# Patient Record
Sex: Male | Born: 1950 | Race: White | Hispanic: No | Marital: Married | Smoking: Former smoker
Health system: Southern US, Community
[De-identification: ages and names within clinical notes are randomized; demographics above are authoritative.]

## PROBLEM LIST (undated history)

## (undated) DIAGNOSIS — B019 Varicella without complication: Secondary | ICD-10-CM

## (undated) DIAGNOSIS — I10 Essential (primary) hypertension: Secondary | ICD-10-CM

## (undated) DIAGNOSIS — J301 Allergic rhinitis due to pollen: Secondary | ICD-10-CM

## (undated) DIAGNOSIS — E7800 Pure hypercholesterolemia, unspecified: Secondary | ICD-10-CM

## (undated) DIAGNOSIS — M47899 Other spondylosis, site unspecified: Secondary | ICD-10-CM

## (undated) DIAGNOSIS — M1990 Unspecified osteoarthritis, unspecified site: Secondary | ICD-10-CM

## (undated) DIAGNOSIS — C61 Malignant neoplasm of prostate: Secondary | ICD-10-CM

## (undated) HISTORY — DX: Essential (primary) hypertension: I10

## (undated) HISTORY — DX: Unspecified osteoarthritis, unspecified site: M19.90

## (undated) HISTORY — DX: Pure hypercholesterolemia, unspecified: E78.00

## (undated) HISTORY — DX: Varicella without complication: B01.9

## (undated) HISTORY — DX: Allergic rhinitis due to pollen: J30.1

## (undated) HISTORY — DX: Other spondylosis, site unspecified: M47.899

## (undated) HISTORY — DX: Malignant neoplasm of prostate: C61

---
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
## 1983-11-11 HISTORY — PX: OTHER SURGICAL HISTORY: SHX169

## 2005-04-08 ENCOUNTER — Encounter: Admission: RE | Admit: 2005-04-08 | Discharge: 2005-07-07 | Payer: Self-pay | Admitting: Family Medicine

## 2007-05-26 ENCOUNTER — Ambulatory Visit: Payer: Self-pay | Admitting: Internal Medicine

## 2007-06-07 ENCOUNTER — Encounter: Payer: Self-pay | Admitting: Internal Medicine

## 2007-06-07 ENCOUNTER — Ambulatory Visit: Payer: Self-pay | Admitting: Internal Medicine

## 2008-06-08 ENCOUNTER — Encounter: Admission: RE | Admit: 2008-06-08 | Discharge: 2008-06-08 | Payer: Self-pay | Admitting: Family Medicine

## 2010-12-02 ENCOUNTER — Encounter: Payer: Self-pay | Admitting: Family Medicine

## 2011-03-19 ENCOUNTER — Encounter: Payer: Self-pay | Admitting: Family Medicine

## 2011-03-19 ENCOUNTER — Ambulatory Visit (INDEPENDENT_AMBULATORY_CARE_PROVIDER_SITE_OTHER): Payer: PRIVATE HEALTH INSURANCE | Admitting: Family Medicine

## 2011-03-19 DIAGNOSIS — E785 Hyperlipidemia, unspecified: Secondary | ICD-10-CM

## 2011-03-19 DIAGNOSIS — Z79899 Other long term (current) drug therapy: Secondary | ICD-10-CM

## 2011-03-19 DIAGNOSIS — M47899 Other spondylosis, site unspecified: Secondary | ICD-10-CM

## 2011-03-19 DIAGNOSIS — Z125 Encounter for screening for malignant neoplasm of prostate: Secondary | ICD-10-CM

## 2011-03-19 DIAGNOSIS — M479 Spondylosis, unspecified: Secondary | ICD-10-CM

## 2011-03-19 DIAGNOSIS — I10 Essential (primary) hypertension: Secondary | ICD-10-CM

## 2011-03-19 HISTORY — DX: Other spondylosis, site unspecified: M47.899

## 2011-03-19 NOTE — Patient Instructions (Signed)
F/u 9-08/2011 for CPX, 30 mins

## 2011-03-19 NOTE — Progress Notes (Signed)
60 year old male:  Est care:  Rheum dz, sees Dr. Corliss Skains HLA B27, on sulfasalazine  HTN: elevated today, a few elevated in record review  Chol: very elevated, on crestor, tricor  Records reviewed  ROS: GEN: No acute illnesses, no fevers, chills. GI: No n/v/d, eating normally Pulm: No SOB Interactive and getting along well at home.  Otherwise, ROS is as per the HPI.  Patient Active Problem List  Diagnoses  . Hyperlipidemia LDL goal < 100  . Hypertension  . HLA-B27 spondyloarthropathy  . Special screening for malignant neoplasm of prostate  . Encounter for long-term (current) use of other medications   Past Medical History  Diagnosis Date  . Arthritis   . Chicken pox   . High cholesterol   . Hypertension 03/19/2011  . HLA-B27 spondyloarthropathy 03/19/2011  . Allergic rhinitis due to pollen    Past Surgical History  Procedure Date  . Tonsillectomy 1958  . Vascetomy 1985   History  Substance Use Topics  . Smoking status: Former Smoker -- 20 years    Types: Cigarettes  . Smokeless tobacco: Former Neurosurgeon    Quit date: 11/10/2000  . Alcohol Use: No   No family history on file. No Known Allergies No current outpatient prescriptions on file prior to visit.    ROS: GEN: No acute illnesses, no fevers, chills. GI: No n/v/d, eating normally Pulm: No SOB Interactive and getting along well at home.  Otherwise, ROS is as per the HPI.  GEN: WDWN, NAD, Non-toxic, A & O x 3 HEENT: Atraumatic, Normocephalic. Neck supple. No masses, No LAD. Ears and Nose: No external deformity. CV: RRR, No M/G/R. No JVD. No thrill. No extra heart sounds. PULM: CTA B, no wheezes, crackles, rhonchi. No retractions. No resp. distress. No accessory muscle use. EXTR: No c/c/e NEURO Normal gait.  PSYCH: Normally interactive. Conversant. Not depressed or anxious appearing.  Calm demeanor.   A/P: Lipids, recheck FLP in a few months, cont med HTN: currently elevated, try to lose 10-15 pounds  ,recheck at [facility] in fall Rheum, f/u with Dr. Algis Downs upcoming

## 2011-03-20 ENCOUNTER — Encounter: Payer: Self-pay | Admitting: Family Medicine

## 2011-07-22 ENCOUNTER — Telehealth: Payer: Self-pay | Admitting: *Deleted

## 2011-07-22 NOTE — Telephone Encounter (Signed)
No need. I can just enter in a direct computer order for a [facility] lab draw. He can go when he wants to the [place] lab and it should work.

## 2011-07-22 NOTE — Telephone Encounter (Signed)
Patient advised.

## 2011-07-22 NOTE — Telephone Encounter (Signed)
Pt cancelled his lab appt for [facility] labs on 9/13 and he is asking if he can have written order faxed to his home fax machine at [phone] to take to [facility] in [place] prior to his appt for his physical.

## 2011-07-24 ENCOUNTER — Other Ambulatory Visit: Payer: PRIVATE HEALTH INSURANCE

## 2011-07-30 ENCOUNTER — Other Ambulatory Visit: Payer: Self-pay | Admitting: Family Medicine

## 2011-07-30 DIAGNOSIS — Z125 Encounter for screening for malignant neoplasm of prostate: Secondary | ICD-10-CM

## 2011-07-30 DIAGNOSIS — R5383 Other fatigue: Secondary | ICD-10-CM

## 2011-07-30 DIAGNOSIS — I10 Essential (primary) hypertension: Secondary | ICD-10-CM

## 2011-07-30 DIAGNOSIS — E785 Hyperlipidemia, unspecified: Secondary | ICD-10-CM

## 2011-07-30 NOTE — Progress Notes (Signed)
Handwritten order given 

## 2011-07-30 NOTE — Telephone Encounter (Signed)
Pt states he went to [facility] this morning to have blood drawn and they didn't have any record of what to draw, he is asking for order.  Has appt to see you on 9/24.

## 2011-07-30 NOTE — Progress Notes (Signed)
There is more then one [facility] lab in [place] and i dont know which one he is at.   Spoke with lab and they said that the lab orders do not show up on their computers and patient will need a written lab order to take with them to the lab.

## 2011-07-30 NOTE — Progress Notes (Deleted)
  Subjective:    Patient ID: Dylan Carter, male    DOB: 21-Feb-1951, 60 y.o.   MRN: [id number]  HPI    Review of Systems     Objective:   Physical Exam        Assessment & Plan:  Spoke with [facility] lab and they said that patient will need a written order to take with them

## 2011-07-30 NOTE — Progress Notes (Signed)
Call the lab and see if they can do it now. May have been done incorrectly before.

## 2011-07-30 NOTE — Telephone Encounter (Signed)
Lab order faxed to patient and lab

## 2011-07-31 ENCOUNTER — Encounter: Payer: PRIVATE HEALTH INSURANCE | Admitting: Family Medicine

## 2011-08-04 ENCOUNTER — Encounter: Payer: Self-pay | Admitting: Family Medicine

## 2011-08-04 ENCOUNTER — Ambulatory Visit (INDEPENDENT_AMBULATORY_CARE_PROVIDER_SITE_OTHER): Payer: PRIVATE HEALTH INSURANCE | Admitting: Family Medicine

## 2011-08-04 VITALS — BP 130/80 | HR 92 | Temp 98.8°F | Ht 70.0 in | Wt 198.8 lb

## 2011-08-04 DIAGNOSIS — D1730 Benign lipomatous neoplasm of skin and subcutaneous tissue of unspecified sites: Secondary | ICD-10-CM

## 2011-08-04 DIAGNOSIS — D1739 Benign lipomatous neoplasm of skin and subcutaneous tissue of other sites: Secondary | ICD-10-CM

## 2011-08-04 DIAGNOSIS — Z0000 Encounter for general adult medical examination without abnormal findings: Secondary | ICD-10-CM

## 2011-08-04 DIAGNOSIS — M722 Plantar fascial fibromatosis: Secondary | ICD-10-CM

## 2011-08-04 NOTE — Progress Notes (Signed)
Subjective:    Patient ID: Dylan Carter, male    DOB: Apr 22, 1951, 60 y.o.   MRN: [id number]  HPI  Dylan Carter, a 60 y.o. male presents today in the office for the following:    Colon - Dr. Marina Goodell, due around 2015.  Tdap - pt reports utd  Has noticed a couple of lumps on his left anterior chest wall. Has been there for the last couple of months.  Also having some pain in his left heel. As walking will feel OK. Will last for about 30 minutes. Barefooted will hurt.   Preventative Health Maintenance Visit:  Health Maintenance Summary Reviewed and updated, unless pt declines services.  Tobacco History Reviewed. Alcohol: No concerns, no excessive use Exercise Habits: Some activity, rec at least 30 mins 5 times a week STD concerns: no risk or activity to increase risk Drug Use: None Encouraged self-testicular check  Labs reviewed with the patient. (scanned from [facility])   Review of Systems General: Denies fever, chills, sweats. No significant weight loss. Eyes: Denies blurring,significant itching ENT: Denies earache, sore throat, and hoarseness. Cardiovascular: Denies chest pains, palpitations, dyspnea on exertion Respiratory: Denies cough, dyspnea at rest,wheeezing Breast: no concerns about lumps GI: Denies nausea, vomiting, diarrhea, constipation, change in bowel habits, abdominal pain, melena, hematochezia GU: Denies penile discharge, ED, urinary flow / outflow problems. No STD concerns. Musculoskeletal: above Derm: above  Neuro: Denies  paresthesias, frequent falls, frequent headaches Psych: Denies depression, anxiety Endocrine: Denies cold intolerance, heat intolerance, polydipsia Heme: Denies enlarged lymph nodes Allergy: No hayfever     Objective:   Physical Exam   Physical Exam  Blood pressure 130/80, pulse 92, temperature 98.8 F (37.1 C), temperature source Oral, height 5\' 10"  (1.778 m), weight 198 lb 12.8 oz (90.175 kg), SpO2 98.00%.  PE: GEN: well  developed, well nourished, no acute distress Eyes: conjunctiva and lids normal, PERRLA, EOMI ENT: TM clear, nares clear, oral exam WNL Neck: supple, no lymphadenopathy, no thyromegaly, no JVD Pulm: clear to auscultation and percussion, respiratory effort normal CV: regular rate and rhythm, S1-S2, no murmur, rub or gallop, no bruits, peripheral pulses normal and symmetric, no cyanosis, clubbing, edema or varicosities Chest: no scars, no gynecomastia  - L ANTERIOR CHEST WALL WITH SMALL MOVEABLE AREAS GI: soft, non-tender; no hepatosplenomegaly, masses; active bowel sounds all quadrants GU: no hernia, testicular mass, penile discharge, priapism or prostate enlargement Lymph: no cervical, axillary or inguinal adenopathy MSK: gait normal, muscle tone and strength WNL, no joint swelling, effusions, discoloration, crepitus . TTP PF INSERTION ON THE L SKIN: clear, good turgor, color WNL, no rashes, lesions, or ulcerations Neuro: normal mental status, normal strength, sensation, and motion Psych: alert; oriented to person, place and time, normally interactive and not anxious or depressed in appearance.       Assessment & Plan:   1. Routine general medical examination at a health care facility   2. Plantar fascia syndrome   3. Lipoma of skin     The patient's preventative maintenance and recommended screening tests for an annual wellness exam were reviewed in full today. Brought up to date unless services declined.  Counselled on the importance of diet, exercise, and its role in overall health and mortality. The patient's FH and SH was reviewed, including their home life, tobacco status, and drug and alcohol status.   PF: We reviewed that stretching is critically important to the treatment of PF. Reviewed footwear. Rigid soles have been shown to help with PF. Reviewed  rehab of stretching and calf raises.   Reassured about lipoma vs cyst

## 2011-08-04 NOTE — Patient Instructions (Signed)
Please read handouts on Plantar Fascitis.  STRETCHING and Strengthening program critically important.  Strengthening on foot and calf muscles as seen in handout. Calf raises, 2 legged, then 1 legged. Foot massage with tennis ball. Ice massage.  NEEDS TO BE DONE EVERY DAY  Recommended over the counter insoles. (Spenco or Hapad)  A rigid shoe with good arch support helps: Dansko (great), Keen, Merrell No easily bendable shoes.   

## 2011-08-07 ENCOUNTER — Encounter: Payer: Self-pay | Admitting: Family Medicine

## 2012-04-19 ENCOUNTER — Telehealth: Payer: Self-pay | Admitting: Family Medicine

## 2012-04-19 NOTE — Telephone Encounter (Signed)
Requests refills on Crestor and Tricor to be sent to [facility] ([facility]  Please call patient back at [phone].

## 2012-04-20 ENCOUNTER — Telehealth: Payer: Self-pay | Admitting: Family Medicine

## 2012-04-20 MED ORDER — FENOFIBRATE 145 MG PO TABS: 145.0000 mg | ORAL_TABLET | Freq: Every day | ORAL | Status: DC

## 2012-04-20 MED ORDER — ROSUVASTATIN CALCIUM 40 MG PO TABS: 40.0000 mg | ORAL_TABLET | Freq: Every day | ORAL | Status: DC

## 2012-04-20 NOTE — Telephone Encounter (Signed)
Pt is needing refill on Tricor 145 mg and Crestor 40 mg. He uses [facility] ??? Fax [phone] His ID# is [id number] and he would like a 90 day supply if possible.

## 2012-04-20 NOTE — Telephone Encounter (Signed)
Already sent in

## 2012-05-31 ENCOUNTER — Encounter (HOSPITAL_COMMUNITY): Payer: Self-pay | Admitting: *Deleted

## 2012-05-31 ENCOUNTER — Emergency Department (HOSPITAL_COMMUNITY)
Admission: EM | Admit: 2012-05-31 | Discharge: 2012-05-31 | Disposition: A | Discharge status: Home / Self Care | Payer: PRIVATE HEALTH INSURANCE | Source: Home / Self Care | Attending: Family Medicine | Admitting: Family Medicine

## 2012-05-31 DIAGNOSIS — J069 Acute upper respiratory infection, unspecified: Secondary | ICD-10-CM

## 2012-05-31 MED ORDER — PREDNISONE 20 MG PO TABS: ORAL_TABLET | ORAL | Status: AC

## 2012-05-31 MED ORDER — HYDROCODONE-ACETAMINOPHEN 7.5-325 MG/15ML PO SOLN: 10.0000 mL | Freq: Three times a day (TID) | ORAL | Status: AC | PRN

## 2012-05-31 MED ORDER — AZITHROMYCIN 250 MG PO TABS: 250.0000 mg | ORAL_TABLET | Freq: Every day | ORAL | Status: AC

## 2012-05-31 NOTE — ED Provider Notes (Signed)
History     CSN: [id number]  Arrival date & time 05/31/12  4098   First MD Initiated Contact with Patient 05/31/12 1846      Chief Complaint  Patient presents with  . Sore Throat    (Consider location/radiation/quality/duration/timing/severity/associated sxs/prior treatment) HPI Comments: 61 year old male former smoker with h/o HTN here c/o cough, congestion and sore throat for about 5 days. Cough improving and non productive but sore throat worsening. Has been doing warm water gargles. Feels discomfort in right side with swallowing, improved rhinorrhea but still some sinus pressure and postnasal drip. Denies fever, headache or chills. appetite is Ok. Denies chest pain or shortness of breath.   Past Medical History  Diagnosis Date  . Arthritis   . Chicken pox   . High cholesterol   . Hypertension 03/19/2011  . HLA-B27 spondyloarthropathy 03/19/2011  . Allergic rhinitis due to pollen     Past Surgical History  Procedure Date  . Tonsillectomy 1958  . Vascetomy 1985    History reviewed. No pertinent family history.  History  Substance Use Topics  . Smoking status: Former Smoker -- 20 years    Types: Cigarettes  . Smokeless tobacco: Former Neurosurgeon    Quit date: 11/10/2000  . Alcohol Use: No      Review of Systems  Constitutional: Negative for fever, chills, diaphoresis, appetite change and fatigue.  HENT: Positive for congestion, sore throat, rhinorrhea, trouble swallowing, postnasal drip and sinus pressure. Negative for facial swelling, neck pain and neck stiffness.   Eyes: Negative for pain, discharge and visual disturbance.  Respiratory: Positive for cough. Negative for shortness of breath and wheezing.   Cardiovascular: Negative for chest pain, palpitations and leg swelling.  Gastrointestinal: Negative for nausea, vomiting, abdominal pain and diarrhea.  Musculoskeletal: Negative for myalgias and arthralgias.  Skin: Negative for rash.  Neurological: Negative for  headaches.    Allergies  Review of patient's allergies indicates no known allergies.  Home Medications   Current Outpatient Rx  Name Route Sig Dispense Refill  . ASPIRIN 81 MG PO TABS Oral Take 81 mg by mouth daily.      Marland Kitchen CALCIUM CARBONATE-VITAMIN D 600-400 MG-UNIT PO TABS Oral Take 1 tablet by mouth daily.      Marland Kitchen DM-PHENYLEPHRINE-ACETAMINOPHEN 10-5-325 MG PO CAPS Oral Take by mouth.    . FENOFIBRATE 145 MG PO TABS Oral Take 1 tablet (145 mg total) by mouth daily. 90 tablet 2  . OMEGA-3 FATTY ACIDS 1000 MG PO CAPS Oral Take 2 g by mouth daily.      Marland Kitchen LORATADINE 10 MG PO TABS Oral Take 10 mg by mouth daily.      Marland Kitchen LYSINE 500 MG PO TABS Oral Take 1 tablet by mouth daily.      . CENTRUM PO Oral Take 1 tablet by mouth daily.      Marland Kitchen ROSUVASTATIN CALCIUM 40 MG PO TABS Oral Take 1 tablet (40 mg total) by mouth daily. 90 tablet 2  . SULFASALAZINE 500 MG PO TABS Oral Take 500 mg by mouth 2 (two) times daily.      Marland Kitchen VITAMIN C 500 MG PO TABS Oral Take 500 mg by mouth daily.      Marland Kitchen VITAMIN E 400 UNITS PO CAPS Oral Take 400 Units by mouth daily.      . AZITHROMYCIN 250 MG PO TABS Oral Take 1 tablet (250 mg total) by mouth daily. Take first 2 tablets together, then 1 every day until finished. 6 tablet 0  .  HYDROCODONE-ACETAMINOPHEN 7.5-325 MG/15ML PO SOLN Oral Take 10 mLs by mouth every 8 (eight) hours as needed for pain (for cough or pain). 120 mL 0  . PREDNISONE 20 MG PO TABS  2 tabs po daily for 3 days 6 tablet 0    BP 149/91  Pulse 76  Temp 98.5 F (36.9 C) (Oral)  Resp 18  SpO2 100%  Physical Exam  Nursing note and vitals reviewed. Constitutional: He is oriented to person, place, and time. He appears well-developed and well-nourished. No distress.  HENT:  Head: Normocephalic and atraumatic.  Right Ear: External ear normal.  Left Ear: External ear normal.       Nasal Congestion with erythema and swelling of nasal turbinates, septal deviation towards right side. Significant pharyngeal  erythema no exudates. No uvula deviation. No trismus. TM's normal  Eyes: Conjunctivae and EOM are normal. Pupils are equal, round, and reactive to light. Right eye exhibits no discharge. Left eye exhibits no discharge. No scleral icterus.  Neck: Normal range of motion. Neck supple. No JVD present. No thyromegaly present.       mildly enlarged right anterior neck side tender adenopathy.   Cardiovascular: Normal rate, regular rhythm and normal heart sounds.   No murmur heard. Pulmonary/Chest: Effort normal and breath sounds normal. He has no wheezes. He has no rales. He exhibits no tenderness.  Lymphadenopathy:    He has cervical adenopathy.  Neurological: He is alert and oriented to person, place, and time.  Skin: No rash noted.    ED Course  Procedures (including critical care time)   Labs Reviewed  POCT RAPID STREP A ([facility] CARE ONLY)   No results found.   1. URI (upper respiratory infection)       MDM  Impress possible viral URI. Although persistent worsening sore throat/tion with small lymph node. Treated with prednisone for 3 days, hydrocodone, continue loratadine, hold prescription for azithromycin to fill if worsening symptoms or if persistent after 48 hours. Return as needed.         Sharin Grave, MD 06/02/12 [phone]

## 2012-05-31 NOTE — ED Notes (Signed)
Pt with c/o sore throat x 4 days - pt had a cough which has resolved - sore throat worse right side of throat

## 2012-06-17 ENCOUNTER — Telehealth: Payer: Self-pay | Admitting: Family Medicine

## 2012-06-17 NOTE — Telephone Encounter (Signed)
Patient wants to know if he can switch from Dr.Copland to Dr.Gutierrez.  Patient's in-laws are Corrie Dandy and Gaspar Cola and they both are patients of Dr.Gutierrez and he has heard a lot of good things about  Dr.Gutierrez and would like to switch.  Can patient switch to Dr.Gutierrez?  He's due for a physical.

## 2012-06-18 ENCOUNTER — Telehealth: Payer: Self-pay | Admitting: Family Medicine

## 2012-06-18 ENCOUNTER — Encounter: Payer: Self-pay | Admitting: Internal Medicine

## 2012-06-18 NOTE — Telephone Encounter (Signed)
Patient will be coming in for a cpx on 08/05/12.  Patient would like a lab order mailed to him,so he can have the lab done before the appointment at [facility] in [place].

## 2012-06-18 NOTE — Telephone Encounter (Signed)
I remember this patient -- he is very nice from my memory. It should not pose any problems.   If that is his preference, then it is OK with me.

## 2012-06-18 NOTE — Telephone Encounter (Signed)
Ok by me if ok by Dr. C. 

## 2012-06-21 NOTE — Telephone Encounter (Signed)
Filled and palced in Kim's box. 

## 2012-06-21 NOTE — Telephone Encounter (Signed)
Mailed as requested.

## 2012-08-04 ENCOUNTER — Encounter: Payer: PRIVATE HEALTH INSURANCE | Admitting: Family Medicine

## 2012-08-05 ENCOUNTER — Ambulatory Visit (INDEPENDENT_AMBULATORY_CARE_PROVIDER_SITE_OTHER): Payer: PRIVATE HEALTH INSURANCE | Admitting: Family Medicine

## 2012-08-05 ENCOUNTER — Encounter: Payer: Self-pay | Admitting: Family Medicine

## 2012-08-05 VITALS — BP 132/78 | HR 60 | Temp 98.0°F | Wt 197.0 lb

## 2012-08-05 DIAGNOSIS — I10 Essential (primary) hypertension: Secondary | ICD-10-CM | POA: Insufficient documentation

## 2012-08-05 DIAGNOSIS — M479 Spondylosis, unspecified: Secondary | ICD-10-CM

## 2012-08-05 DIAGNOSIS — E785 Hyperlipidemia, unspecified: Secondary | ICD-10-CM

## 2012-08-05 DIAGNOSIS — M47899 Other spondylosis, site unspecified: Secondary | ICD-10-CM

## 2012-08-05 DIAGNOSIS — R030 Elevated blood-pressure reading, without diagnosis of hypertension: Secondary | ICD-10-CM

## 2012-08-05 DIAGNOSIS — Z0000 Encounter for general adult medical examination without abnormal findings: Secondary | ICD-10-CM | POA: Insufficient documentation

## 2012-08-05 DIAGNOSIS — Z125 Encounter for screening for malignant neoplasm of prostate: Secondary | ICD-10-CM

## 2012-08-05 LAB — COMPREHENSIVE METABOLIC PANEL
AST: 26 U/L (ref 0–37)
BUN: 14 mg/dL (ref 6–23)
Calcium: 9.9 mg/dL (ref 8.4–10.5)
Chloride: 105 mEq/L (ref 96–112)
Creatinine, Ser: 1 mg/dL (ref 0.4–1.5)
Total Bilirubin: 0.7 mg/dL (ref 0.3–1.2)

## 2012-08-05 LAB — LIPID PANEL
Cholesterol: 174 mg/dL (ref 0–200)
HDL: 32.7 mg/dL — ABNORMAL LOW (ref >39.00)
LDL Cholesterol: 114 mg/dL — ABNORMAL HIGH (ref 0–99)
Total CHOL/HDL Ratio: 5
Triglycerides: 139 mg/dL (ref 0.0–149.0)
VLDL: 27.8 mg/dL (ref 0.0–40.0)

## 2012-08-05 LAB — CBC WITH DIFFERENTIAL/PLATELET
Basophils Relative: 0.9 % (ref 0.0–3.0)
HCT: 45.2 % (ref 39.0–52.0)
Hemoglobin: 14.7 g/dL (ref 13.0–17.0)
Lymphocytes Relative: 37.3 % (ref 12.0–46.0)
Lymphs Abs: 2.4 10*3/uL (ref 0.7–4.0)
MCHC: 32.6 g/dL (ref 30.0–36.0)
Monocytes Relative: 8 % (ref 3.0–12.0)
Neutro Abs: 3.2 10*3/uL (ref 1.4–7.7)
RBC: 4.91 Mil/uL (ref 4.22–5.81)

## 2012-08-05 NOTE — Progress Notes (Signed)
Subjective:    Patient ID: Dylan Carter, male    DOB: [birth year], 61 y.o.   MRN: [id number]  HPI CC: CPE  Would like blood work done at [facility], would like liver function checked.  H/o knee arthritis, sees Dr. Corliss Skains.  Preventative: Colonoscopy done 2008 (Dr. Marina Goodell).  rec rpt late this year.  Told polyps.  Has had 2 in past. Prostate screening - would like screening. Tetanus - thinks UTD - will check at home. Flu shot at work Shingles shot - will check with insurance  Sunscreen use discussed.  Tries to wear hat - worried about some places on face. Seatbelt use discussed.  Lives with wife, has grown children Occupation: delivery of parts for [employer] Activity: maintains property, gardening, occasional walking Diet: some water, fruits/vegetables daily  Medications and allergies reviewed and updated in chart.  Past histories reviewed and updated if relevant as below. Patient Active Problem List  Diagnosis  . Hyperlipidemia LDL goal < 100  . Hypertension  . HLA-B27 spondyloarthropathy  . Special screening for malignant neoplasm of prostate  . Encounter for long-term (current) use of other medications  . Healthcare maintenance   Past Medical History  Diagnosis Date  . Arthritis   . Chicken pox   . High cholesterol   . HLA-B27 spondyloarthropathy 03/19/2011  . Allergic rhinitis due to pollen   . Elevated BP    Past Surgical History  Procedure Date  . Tonsillectomy 1958  . Vascetomy 1985   History  Substance Use Topics  . Smoking status: Former Smoker -- 20 years    Types: Cigarettes  . Smokeless tobacco: Former Neurosurgeon    Quit date: 11/10/2000  . Alcohol Use: No   Family History  Problem Relation Age of Onset  . Stroke Father   . Coronary artery disease Mother   . Cancer Other     aunts and uncles  . Diabetes Father    No Known Allergies Current Outpatient Prescriptions on File Prior to Visit  Medication Sig Dispense Refill  . aspirin 81 MG tablet Take  81 mg by mouth daily.        . fish oil-omega-3 fatty acids 1000 MG capsule Take 2 g by mouth daily.        Marland Kitchen loratadine (CLARITIN) 10 MG tablet Take 10 mg by mouth daily.        Marland Kitchen Lysine 500 MG TABS Take 1 tablet by mouth daily.        . Multiple Vitamins-Minerals (CENTRUM PO) Take 1 tablet by mouth daily.        Marland Kitchen sulfaSALAzine (AZULFIDINE) 500 MG tablet Take 500 mg by mouth 2 (two) times daily.        . vitamin C (ASCORBIC ACID) 500 MG tablet Take 500 mg by mouth daily.        . vitamin E 400 UNIT capsule Take 400 Units by mouth daily.        Marland Kitchen DISCONTD: fenofibrate (TRICOR) 145 MG tablet Take 1 tablet (145 mg total) by mouth daily.  90 tablet  2  . DISCONTD: rosuvastatin (CRESTOR) 40 MG tablet Take 1 tablet (40 mg total) by mouth daily.  90 tablet  2     Review of Systems  Constitutional: Negative for fever, chills, activity change, appetite change, fatigue and unexpected weight change.  HENT: Negative for hearing loss and neck pain.   Eyes: Negative for visual disturbance.  Respiratory: Negative for cough, chest tightness, shortness of breath and wheezing.  Cardiovascular: Negative for chest pain, palpitations and leg swelling.  Gastrointestinal: Negative for nausea, vomiting, abdominal pain, diarrhea, constipation, blood in stool and abdominal distention.  Genitourinary: Negative for hematuria and difficulty urinating.  Musculoskeletal: Negative for myalgias and arthralgias.  Skin: Negative for rash.  Neurological: Negative for dizziness, seizures, syncope and headaches.  Hematological: Does not bruise/bleed easily.  Psychiatric/Behavioral: Negative for dysphoric mood. The patient is not nervous/anxious.        Objective:   Physical Exam  Nursing note and vitals reviewed. Constitutional: He is oriented to person, place, and time. He appears well-developed and well-nourished. No distress.  HENT:  Head: Normocephalic and atraumatic.  Right Ear: Hearing, tympanic membrane,  external ear and ear canal normal.  Left Ear: Hearing, tympanic membrane, external ear and ear canal normal.  Nose: Nose normal.  Mouth/Throat: Oropharynx is clear and moist. No oropharyngeal exudate.  Eyes: Conjunctivae normal and EOM are normal. Pupils are equal, round, and reactive to light. No scleral icterus.  Neck: Normal range of motion. Neck supple. Carotid bruit is not present.  Cardiovascular: Normal rate, regular rhythm, normal heart sounds and intact distal pulses.   No murmur heard. Pulses:      Radial pulses are 2+ on the right side, and 2+ on the left side.  Pulmonary/Chest: Effort normal and breath sounds normal. No respiratory distress. He has no wheezes. He has no rales.  Abdominal: Soft. Bowel sounds are normal. He exhibits no distension and no mass. There is no tenderness. There is no rebound and no guarding.  Genitourinary: Rectum normal. Rectal exam shows no external hemorrhoid, no internal hemorrhoid, no fissure, no mass, no tenderness and anal tone normal. Prostate is not enlarged (20gm) and not tender.       firm prostate bilateral lobes  Musculoskeletal: Normal range of motion. He exhibits no edema.  Lymphadenopathy:    He has no cervical adenopathy.  Neurological: He is alert and oriented to person, place, and time.       CN grossly intact, station and gait intact  Skin: Skin is warm and dry. No rash noted.       Few AKs on face  Psychiatric: He has a normal mood and affect. His behavior is normal. Judgment and thought content normal.       Assessment & Plan:

## 2012-08-05 NOTE — Assessment & Plan Note (Addendum)
Preventative protocols reviewed and updated unless pt declined. Discussed healthy diet and lifestyle.  Blood work today. DRE - firm prostate, minimally enlarged.  Await PSA.

## 2012-08-05 NOTE — Patient Instructions (Addendum)
Stop calcium, just take vitamin D 1000 IU daily. Check at home for last tetanus shot. Flu shot at work. Call your insurace about the shingles shot to see if it is covered or how much it would cost and where is cheaper (here or pharmacy).  If you want to receive here, call for nurse visit. Blood work today - we will call you with results.

## 2012-08-05 NOTE — Assessment & Plan Note (Signed)
Recheck levels 

## 2012-08-05 NOTE — Assessment & Plan Note (Signed)
Continue to monitor.  Not a problem today.

## 2012-08-05 NOTE — Assessment & Plan Note (Signed)
Followed by Dr. Corliss Skains.  Continue to monitor.

## 2012-09-22 ENCOUNTER — Telehealth: Payer: Self-pay

## 2012-09-22 ENCOUNTER — Encounter: Payer: Self-pay | Admitting: Family Medicine

## 2012-09-22 NOTE — Telephone Encounter (Signed)
Pt has billing question about 08/05/12 visit. Gave pt billing # [phone]. Pt also did not use activation code for my chart before expired; pt will call back to get new code.

## 2012-10-01 ENCOUNTER — Encounter: Payer: Self-pay | Admitting: Family Medicine

## 2013-02-17 ENCOUNTER — Encounter: Payer: Self-pay | Admitting: Internal Medicine

## 2013-07-13 ENCOUNTER — Telehealth: Payer: Self-pay | Admitting: Family Medicine

## 2013-07-13 NOTE — Telephone Encounter (Signed)
Pt wants to have labs completed at [facility] on [address]. Can you please mail the Rx for his laps for CPE to his home address? Thank you.

## 2013-07-14 NOTE — Telephone Encounter (Signed)
Mailed as requested.

## 2013-07-14 NOTE — Telephone Encounter (Signed)
Written out and placed in Kims' box.

## 2013-08-05 ENCOUNTER — Encounter: Payer: PRIVATE HEALTH INSURANCE | Admitting: Family Medicine

## 2013-08-09 ENCOUNTER — Encounter: Payer: Self-pay | Admitting: Family Medicine

## 2013-08-10 ENCOUNTER — Other Ambulatory Visit: Payer: Self-pay | Admitting: Family Medicine

## 2013-08-10 DIAGNOSIS — C61 Malignant neoplasm of prostate: Secondary | ICD-10-CM

## 2013-08-10 HISTORY — DX: Malignant neoplasm of prostate: C61

## 2013-08-10 LAB — COMPREHENSIVE METABOLIC PANEL
Albumin: 4.5 g/dL (ref 3.5–5.2)
BUN: 13 mg/dL (ref 6–23)
Calcium: 9.5 mg/dL (ref 8.4–10.5)
Chloride: 106 mEq/L (ref 96–112)
Creat: 0.91 mg/dL (ref 0.50–1.35)
Glucose, Bld: 104 mg/dL — ABNORMAL HIGH (ref 70–99)
Potassium: 4.3 mEq/L (ref 3.5–5.3)

## 2013-08-10 LAB — LIPID PANEL
Cholesterol: 158 mg/dL (ref 0–200)
HDL: 35 mg/dL — ABNORMAL LOW (ref >39)
Triglycerides: 164 mg/dL — ABNORMAL HIGH (ref <150)

## 2013-08-15 ENCOUNTER — Ambulatory Visit (INDEPENDENT_AMBULATORY_CARE_PROVIDER_SITE_OTHER): Payer: PRIVATE HEALTH INSURANCE | Admitting: Family Medicine

## 2013-08-15 ENCOUNTER — Encounter: Payer: Self-pay | Admitting: Family Medicine

## 2013-08-15 VITALS — BP 160/94 | HR 72 | Temp 98.6°F | Ht 70.0 in | Wt 192.5 lb

## 2013-08-15 DIAGNOSIS — C61 Malignant neoplasm of prostate: Secondary | ICD-10-CM | POA: Insufficient documentation

## 2013-08-15 DIAGNOSIS — Z0000 Encounter for general adult medical examination without abnormal findings: Secondary | ICD-10-CM

## 2013-08-15 DIAGNOSIS — R030 Elevated blood-pressure reading, without diagnosis of hypertension: Secondary | ICD-10-CM

## 2013-08-15 DIAGNOSIS — E785 Hyperlipidemia, unspecified: Secondary | ICD-10-CM

## 2013-08-15 DIAGNOSIS — R972 Elevated prostate specific antigen [PSA]: Secondary | ICD-10-CM

## 2013-08-15 MED ORDER — AMLODIPINE BESYLATE 5 MG PO TABS: 5.0000 mg | ORAL_TABLET | Freq: Every day | ORAL | Status: DC

## 2013-08-15 MED ORDER — ROSUVASTATIN CALCIUM 40 MG PO TABS: 40.0000 mg | ORAL_TABLET | Freq: Every day | ORAL | Status: DC

## 2013-08-15 MED ORDER — FENOFIBRATE 145 MG PO TABS: 145.0000 mg | ORAL_TABLET | Freq: Every day | ORAL | Status: DC

## 2013-08-15 NOTE — Patient Instructions (Addendum)
Check at home for latest tetanus shot. Call your insurance about the shingles shot to see if it is covered or how much it would cost and where is cheaper (here or pharmacy).  If you want to receive here, call for nurse visit. Good to see you today, return in 3 months for follow up blood pressure. Start amlodipine 5mg  daily. We will call you for referral to urologist for elevated PSA.

## 2013-08-15 NOTE — Assessment & Plan Note (Signed)
Tolerating tricor and crestor - continue meds.

## 2013-08-15 NOTE — Assessment & Plan Note (Signed)
Preventative protocols reviewed and updated unless pt declined. Discussed healthy diet and lifestyle. Recommended schedule colonoscopy as due. Pt will check on tetanus status and zostavax cost.

## 2013-08-15 NOTE — Assessment & Plan Note (Signed)
New dx - persistently elevated per home readings. Start amlodipine - discussed common side effects of ankle swelling and flushing. rtc 3 mo for f/u. rec start tracking at home.

## 2013-08-15 NOTE — Assessment & Plan Note (Signed)
Slightly elevated PSA, in conjunction with somewhat indurated enlarged prostate. Discussed options with patient, will refer to urology for further eval.

## 2013-08-15 NOTE — Progress Notes (Signed)
Subjective:    Patient ID: Dylan Carter, male    DOB: 03-29-1951, 62 y.o.   MRN: [id number]  HPI CC: CPE  Elevated blood pressure today.  Has bp cuff, hasn't been cheking.  Last check 2 mo ago, 160/90.  Increased stress - recently lost mom. BP Readings from Last 3 Encounters:  08/15/13 164/92  08/05/12 132/78  05/31/12 149/91   Wt Readings from Last 3 Encounters:  08/15/13 192 lb 8 oz (87.317 kg)  08/05/12 197 lb (89.359 kg)  08/04/11 198 lb 12.8 oz (90.175 kg)    Preventative:  Colonoscopy done 2008 (Dr. Marina Goodell). rec rpt last year - has not f/u yet.  Has had several reminders.  Knows he needs to get this done.  No BM changes or blood in stool. Prostate screening - would like screening.  strong stream.  Nocturia x1. Tetanus - thinks UTD but unsure.   Flu shot at work next week. Shingles shot - will check with insurance   Sunscreen use discussed. Tries to wear hat - worried about some places on face.   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  Past histories reviewed and updated if relevant as below. Patient Active Problem List   Diagnosis Date Noted  . Healthcare maintenance 08/05/2012  . Elevated BP   . Hyperlipidemia LDL goal < 100 03/19/2011  . HLA-B27 spondyloarthropathy 03/19/2011   Past Medical History  Diagnosis Date  . Arthritis   . Chicken pox   . High cholesterol   . HLA-B27 spondyloarthropathy 03/19/2011  . Elevated BP   . Allergic rhinitis due to pollen    Past Surgical History  Procedure Laterality Date  . Tonsillectomy  1958  . Vascetomy  1985   History  Substance Use Topics  . Smoking status: Former Smoker -- 20 years    Types: Cigarettes  . Smokeless tobacco: Former Neurosurgeon    Quit date: 11/10/2000  . Alcohol Use: No   Family History   Problem Relation Age of Onset  . Stroke Father   . Coronary artery disease Mother   . Cancer Other     aunts and uncles  . Diabetes Father    No Known Allergies Current Outpatient Prescriptions on File Prior to Visit  Medication Sig Dispense Refill  . aspirin 81 MG tablet Take 81 mg by mouth daily.        . cholecalciferol (VITAMIN D) 1000 UNITS tablet Take 1,000 Units by mouth daily.      . fenofibrate (TRICOR) 145 MG tablet Take 145 mg by mouth at bedtime.      . fish oil-omega-3 fatty acids 1000 MG capsule Take 2 g by mouth daily.        . Glucosamine-Chondroit-Vit C-Mn (GLUCOSAMINE 1500 COMPLEX PO) Take by mouth 2 (two) times daily.      Marland Kitchen loratadine (CLARITIN) 10 MG tablet Take 10 mg by mouth daily.        Marland Kitchen Lysine 500 MG TABS Take 1 tablet by mouth daily.        . Multiple Vitamins-Minerals (CENTRUM PO) Take 1 tablet by mouth daily.        . rosuvastatin (CRESTOR) 40 MG tablet Take 40 mg by mouth at bedtime.      . sulfaSALAzine (AZULFIDINE) 500 MG tablet Take 500 mg by mouth 2 (two) times  daily.        . vitamin C (ASCORBIC ACID) 500 MG tablet Take 500 mg by mouth daily.        . vitamin E 400 UNIT capsule Take 400 Units by mouth daily.         No current facility-administered medications on file prior to visit.     Review of Systems  Constitutional: Negative for fever, chills, activity change, appetite change, fatigue and unexpected weight change.  HENT: Negative for hearing loss and neck pain.   Eyes: Negative for visual disturbance.  Respiratory: Negative for cough, chest tightness, shortness of breath and wheezing.   Cardiovascular: Negative for chest pain, palpitations and leg swelling.  Gastrointestinal: Negative for nausea, vomiting, abdominal pain, diarrhea, constipation, blood in stool and abdominal distention.  Genitourinary: Negative for hematuria and difficulty urinating.  Musculoskeletal: Negative for myalgias and arthralgias.  Skin: Negative for rash.   Neurological: Negative for dizziness, seizures, syncope and headaches.  Hematological: Negative for adenopathy. Does not bruise/bleed easily.  Psychiatric/Behavioral: Negative for dysphoric mood. The patient is not nervous/anxious.        Objective:   Physical Exam  Nursing note and vitals reviewed. Constitutional: He is oriented to person, place, and time. He appears well-developed and well-nourished. No distress.  HENT:  Head: Normocephalic and atraumatic.  Right Ear: External ear normal.  Left Ear: External ear normal.  Nose: Nose normal.  Mouth/Throat: Oropharynx is clear and moist. No oropharyngeal exudate.  Eyes: Conjunctivae and EOM are normal. Pupils are equal, round, and reactive to light. No scleral icterus.  Neck: Normal range of motion. Neck supple. No thyromegaly present.  Cardiovascular: Normal rate, regular rhythm, normal heart sounds and intact distal pulses.   No murmur heard. Pulses:      Radial pulses are 2+ on the right side, and 2+ on the left side.  Pulmonary/Chest: Effort normal and breath sounds normal. No respiratory distress. He has no wheezes. He has no rales.  Abdominal: Soft. Bowel sounds are normal. He exhibits no distension and no mass. There is no tenderness. There is no rebound and no guarding.  Genitourinary: Rectum normal. Rectal exam shows no external hemorrhoid, no internal hemorrhoid, no fissure, no mass, no tenderness and anal tone normal. Prostate is enlarged (30gm, bilaterally indurated). Prostate is not tender.  Musculoskeletal: Normal range of motion. He exhibits no edema.  Lymphadenopathy:    He has no cervical adenopathy.  Neurological: He is alert and oriented to person, place, and time.  CN grossly intact, station and gait intact  Skin: Skin is warm and dry. No rash noted.  Psychiatric: He has a normal mood and affect. His behavior is normal. Judgment and thought content normal.      Assessment & Plan:

## 2013-09-15 ENCOUNTER — Other Ambulatory Visit: Payer: Self-pay

## 2013-10-02 ENCOUNTER — Encounter: Payer: Self-pay | Admitting: Family Medicine

## 2013-11-18 ENCOUNTER — Ambulatory Visit (INDEPENDENT_AMBULATORY_CARE_PROVIDER_SITE_OTHER): Payer: PRIVATE HEALTH INSURANCE | Admitting: Family Medicine

## 2013-11-18 ENCOUNTER — Encounter: Payer: Self-pay | Admitting: Family Medicine

## 2013-11-18 VITALS — BP 130/70 | HR 80 | Temp 98.6°F | Wt 203.5 lb

## 2013-11-18 DIAGNOSIS — R972 Elevated prostate specific antigen [PSA]: Secondary | ICD-10-CM

## 2013-11-18 DIAGNOSIS — I10 Essential (primary) hypertension: Secondary | ICD-10-CM

## 2013-11-18 DIAGNOSIS — Z23 Encounter for immunization: Secondary | ICD-10-CM

## 2013-11-18 NOTE — Assessment & Plan Note (Signed)
S/p prostate biopsy by urology, pending f/u in 04/2014. [name])

## 2013-11-18 NOTE — Addendum Note (Signed)
Addended by: Royann Shivers A on: 11/18/2013 08:44 AM   Modules accepted: Orders

## 2013-11-18 NOTE — Patient Instructions (Signed)
Tdap today (tetanus and pertussis). Check on shingles shot with insurance. Blood pressure is looking much better - continue amlodipine 5mg  daily while watching for ankle swelling. Good to see you today, call us with questions. Return for next wellness exam.

## 2013-11-18 NOTE — Assessment & Plan Note (Signed)
Improved readings on amlodipine 5mg  - continue med. Pt agrees with plan.

## 2013-11-18 NOTE — Progress Notes (Signed)
   Subjective:    Patient ID: Dylan Carter, male    DOB: 1951/10/21, 63 y.o.   MRN: [id number]  HPI CC: 3 mo HTN f/u  HTN - Compliant with current antihypertensive regimen of amlodipine $RemoveBefor'5mg'ofOAXJaOVoek$ .  Does check blood pressures at home: wife endorses good control.  No low blood pressure readings or symptoms of dizziness/syncope.  Denies HA, vision changes, CP/tightness, SOB, leg swelling. BP Readings from Last 3 Encounters:  11/18/13 130/70  08/15/13 160/94  08/05/12 132/78    Last visit also found to have elevated prostate s/p biopsy by Dr. [name].  Had biopsy last month - rec return in 6 mo.  Lab Results  Component Value Date   PSA 4.40* 08/10/2013   PSA 3.58 08/05/2012   Flu shot - did receive at work. Tetanus shot - done 2003 zostavax - unsure.  Past Medical History  Diagnosis Date  . Arthritis   . Chicken pox   . High cholesterol   . HLA-B27 spondyloarthropathy 03/19/2011    deveshwar  . HTN (hypertension)   . Allergic rhinitis due to pollen   . Elevated PSA 08/2013    pending biopsy (Dr. [name])      Review of Systems Per HPI    Objective:   Physical Exam  Nursing note and vitals reviewed. Constitutional: He appears well-developed and well-nourished. No distress.  HENT:  Mouth/Throat: Oropharynx is clear and moist. No oropharyngeal exudate.  Cardiovascular: Normal rate, regular rhythm and intact distal pulses.   Murmur (2/6 SEM best at apex) heard. Pulmonary/Chest: Effort normal and breath sounds normal. No respiratory distress. He has no wheezes. He has no rales.  Musculoskeletal: He exhibits no edema.  Skin: Skin is warm and dry. No rash noted.       Assessment & Plan:

## 2013-11-18 NOTE — Progress Notes (Signed)
Pre-visit discussion using our clinic review tool. No additional management support is needed unless otherwise documented below in the visit note.  

## 2014-04-06 LAB — COMPREHENSIVE METABOLIC PANEL
ALT: 26 U/L (ref 10–40)
AST: 20 U/L
Alkaline Phosphatase: 69 U/L
BILIRUBIN TOTAL: 0.5 mg/dL
Creat: 0.82
Glucose: 155
SODIUM: 138 mmol/L (ref 137–147)

## 2014-04-06 LAB — CBC
HGB: 15 g/dL
WBC: 5.4
platelet count: 246

## 2014-04-12 ENCOUNTER — Encounter: Payer: Self-pay | Admitting: *Deleted

## 2014-08-25 ENCOUNTER — Other Ambulatory Visit: Payer: Self-pay

## 2014-09-25 ENCOUNTER — Encounter: Payer: Self-pay | Admitting: Family Medicine

## 2014-09-25 MED ORDER — ROSUVASTATIN CALCIUM 40 MG PO TABS: 40.0000 mg | ORAL_TABLET | Freq: Every day | ORAL | Status: DC

## 2014-09-25 MED ORDER — FENOFIBRATE 145 MG PO TABS: 145.0000 mg | ORAL_TABLET | Freq: Every day | ORAL | Status: DC

## 2014-09-25 MED ORDER — AMLODIPINE BESYLATE 5 MG PO TABS: 5.0000 mg | ORAL_TABLET | Freq: Every day | ORAL | Status: DC

## 2014-10-12 LAB — CBC AND DIFFERENTIAL
Hemoglobin: 16 g/dL (ref 13.5–17.5)
PLATELETS: 248 10*3/uL (ref 150–399)
WBC: 6.7 10*3/mL

## 2014-10-12 LAB — BASIC METABOLIC PANEL
Creatinine: 0.8 mg/dL (ref 0.6–1.3)
Sodium: 137 mmol/L (ref 137–147)

## 2014-10-23 ENCOUNTER — Encounter: Payer: Self-pay | Admitting: Family Medicine

## 2014-11-06 ENCOUNTER — Other Ambulatory Visit: Payer: Self-pay | Admitting: Family Medicine

## 2014-11-07 ENCOUNTER — Telehealth: Payer: Self-pay

## 2014-11-07 NOTE — Telephone Encounter (Signed)
Pt left v/m after 4:30 pm today because pt has lab appt scheduled 11/08/14 at 7:30 for CPX labs and pt wanted to see if [facility] or [facility] had faxed over lab results. Under media see lab results for PSA faxed from [facility]. Pt will speak with lab personnel in AM to see what testing is planned for lab appt in AM.

## 2014-11-08 ENCOUNTER — Other Ambulatory Visit: Payer: Self-pay | Admitting: Family Medicine

## 2014-11-08 ENCOUNTER — Other Ambulatory Visit (INDEPENDENT_AMBULATORY_CARE_PROVIDER_SITE_OTHER): Payer: PRIVATE HEALTH INSURANCE

## 2014-11-08 DIAGNOSIS — I10 Essential (primary) hypertension: Secondary | ICD-10-CM

## 2014-11-08 DIAGNOSIS — R972 Elevated prostate specific antigen [PSA]: Secondary | ICD-10-CM

## 2014-11-08 DIAGNOSIS — E785 Hyperlipidemia, unspecified: Secondary | ICD-10-CM

## 2014-11-08 LAB — LIPID PANEL
Cholesterol: 177 mg/dL (ref 0–200)
HDL: 33 mg/dL — AB (ref >39.00)
LDL CALC: 109 mg/dL — AB (ref 0–99)
NONHDL: 144
Total CHOL/HDL Ratio: 5
Triglycerides: 176 mg/dL — ABNORMAL HIGH (ref 0.0–149.0)
VLDL: 35.2 mg/dL (ref 0.0–40.0)

## 2014-11-08 LAB — COMPREHENSIVE METABOLIC PANEL
ALBUMIN: 4.3 g/dL (ref 3.5–5.2)
ALK PHOS: 70 U/L (ref 39–117)
ALT: 27 U/L (ref 0–53)
AST: 24 U/L (ref 0–37)
BUN: 13 mg/dL (ref 6–23)
CO2: 25 mEq/L (ref 19–32)
Calcium: 9.6 mg/dL (ref 8.4–10.5)
Chloride: 107 mEq/L (ref 96–112)
Creatinine, Ser: 0.9 mg/dL (ref 0.4–1.5)
GFR: 90.6 mL/min (ref >60.00)
Glucose, Bld: 110 mg/dL — ABNORMAL HIGH (ref 70–99)
POTASSIUM: 4.1 meq/L (ref 3.5–5.1)
SODIUM: 140 meq/L (ref 135–145)
TOTAL PROTEIN: 7.2 g/dL (ref 6.0–8.3)
Total Bilirubin: 0.4 mg/dL (ref 0.2–1.2)

## 2014-11-08 NOTE — Telephone Encounter (Signed)
Pt came in for labs this am, he said he had already had PSA done by urologist. Per our record, we do have this result on file. PSA canceled.

## 2014-11-09 ENCOUNTER — Telehealth: Payer: Self-pay | Admitting: Family Medicine

## 2014-11-09 NOTE — Telephone Encounter (Signed)
emmi emailed °

## 2014-11-11 ENCOUNTER — Encounter: Payer: Self-pay | Admitting: Family Medicine

## 2014-11-15 ENCOUNTER — Encounter: Payer: Self-pay | Admitting: Family Medicine

## 2014-11-15 ENCOUNTER — Ambulatory Visit (INDEPENDENT_AMBULATORY_CARE_PROVIDER_SITE_OTHER): Payer: PRIVATE HEALTH INSURANCE | Admitting: Family Medicine

## 2014-11-15 VITALS — BP 142/82 | HR 82 | Temp 98.2°F | Ht 70.0 in | Wt 193.2 lb

## 2014-11-15 DIAGNOSIS — L570 Actinic keratosis: Secondary | ICD-10-CM

## 2014-11-15 DIAGNOSIS — Z1283 Encounter for screening for malignant neoplasm of skin: Secondary | ICD-10-CM

## 2014-11-15 DIAGNOSIS — M47899 Other spondylosis, site unspecified: Secondary | ICD-10-CM

## 2014-11-15 DIAGNOSIS — Z0000 Encounter for general adult medical examination without abnormal findings: Secondary | ICD-10-CM

## 2014-11-15 DIAGNOSIS — R972 Elevated prostate specific antigen [PSA]: Secondary | ICD-10-CM

## 2014-11-15 DIAGNOSIS — E785 Hyperlipidemia, unspecified: Secondary | ICD-10-CM

## 2014-11-15 DIAGNOSIS — I10 Essential (primary) hypertension: Secondary | ICD-10-CM

## 2014-11-15 MED ORDER — AMLODIPINE BESYLATE 5 MG PO TABS: 5.0000 mg | ORAL_TABLET | Freq: Every day | ORAL | Status: DC

## 2014-11-15 MED ORDER — ROSUVASTATIN CALCIUM 40 MG PO TABS: 40.0000 mg | ORAL_TABLET | Freq: Every day | ORAL | Status: DC

## 2014-11-15 MED ORDER — FENOFIBRATE 145 MG PO TABS: 145.0000 mg | ORAL_TABLET | Freq: Every day | ORAL | Status: DC

## 2014-11-15 NOTE — Progress Notes (Signed)
Pre visit review using our clinic review tool, if applicable. No additional management support is needed unless otherwise documented below in the visit note. 

## 2014-11-15 NOTE — Patient Instructions (Addendum)
Call to schedule colonoscopy (Dr Henrene Pastor). Call your insurance about the shingles shot to see if it is covered or how much it would cost and where is cheaper (here or pharmacy).  If you want to receive here, call for nurse visit.  We will call you over the next few weeks to schedule dermatology appointment. Return as needed or in 1 year for next physical.

## 2014-11-15 NOTE — Assessment & Plan Note (Signed)
Followed by Estanislado Pandy. On sulfasalazine

## 2014-11-15 NOTE — Progress Notes (Signed)
BP 142/82 mmHg  Pulse 82  Temp(Src) 98.2 F (36.8 C) (Oral)  Ht $R'5\' 10"'iJ$  (1.778 m)  Wt 193 lb 4 oz (87.658 kg)  BMI 27.73 kg/m2   CC: CPE  Subjective:    Patient ID: Dylan Carter, male    DOB: 01-13-1951, 64 y.o.   MRN: [id number]  HPI: Dylan Carter is a 64 y.o. male presenting on 11/15/2014 for Annual Exam   10lb weight loss in last several months!  Preventative: Colonoscopy done 2008 (Dr. Henrene Pastor). Rec rpt last year - has not f/u yet. Has had several reminders. Knows he needs to get this done. No BM changes or blood in stool.  Prostate cancer screening - h/o elevated PSA and enlarged prostate s/p biopsy x2 with atypia at L apex Dylan Carter). F/u Q6 mo. Flu shot at work Tdap 11/2013 Shingles shot - will check with insurance   Sunscreen use discussed. Tries to wear hat - worried about some places on face.  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 Interim medical history since our last visit reviewed. Allergies and medications reviewed and updated. Current Outpatient Prescriptions on File Prior to Visit  Medication Sig  . aspirin 81 MG tablet Take 81 mg by mouth daily.    . cholecalciferol (VITAMIN D) 1000 UNITS tablet Take 1,000 Units by mouth daily.  . fish oil-omega-3 fatty acids 1000 MG capsule Take 2 g by mouth daily.    . Glucosamine-Chondroit-Vit C-Mn (GLUCOSAMINE 1500 COMPLEX PO) Take by mouth 2 (two) times daily.  Marland Kitchen loratadine (CLARITIN) 10 MG tablet Take 10 mg by mouth daily.    Marland Kitchen Lysine 500 MG TABS Take 1 tablet by mouth daily.    . Multiple Vitamins-Minerals (CENTRUM PO) Take 1 tablet by mouth daily.    Marland Kitchen sulfaSALAzine (AZULFIDINE) 500 MG tablet Take 500 mg by mouth 2 (two) times daily.    .  vitamin C (ASCORBIC ACID) 500 MG tablet Take 500 mg by mouth daily.    . vitamin E 400 UNIT capsule Take 400 Units by mouth daily.     No current facility-administered medications on file prior to visit.    Review of Systems  Constitutional: Negative for fever, chills, activity change, appetite change, fatigue and unexpected weight change.  HENT: Negative for hearing loss.   Eyes: Negative for visual disturbance.  Respiratory: Negative for cough, chest tightness, shortness of breath and wheezing.   Cardiovascular: Negative for chest pain, palpitations and leg swelling.  Gastrointestinal: Negative for nausea, vomiting, abdominal pain, diarrhea, constipation, blood in stool and abdominal distention.  Genitourinary: Negative for hematuria and difficulty urinating.  Musculoskeletal: Negative for myalgias, arthralgias and neck pain.  Skin: Negative for rash.  Neurological: Negative for dizziness, seizures, syncope and headaches.  Hematological: Negative for adenopathy. Does not bruise/bleed easily.  Psychiatric/Behavioral: Negative for dysphoric mood. The patient is not nervous/anxious.    Per HPI unless specifically indicated above     Objective:    BP 142/82 mmHg  Pulse 82  Temp(Src) 98.2 F (36.8 C) (Oral)  Ht $R'5\' 10"'uF$  (1.778 m)  Wt 193 lb 4 oz (87.658 kg)  BMI 27.73 kg/m2  Wt Readings from Last 3 Encounters:  11/15/14 193 lb 4 oz (87.658 kg)  11/18/13 203 lb 8 oz (92.307 kg)  08/15/13 192 lb 8 oz (87.317 kg)    Physical Exam  Constitutional: He is oriented to person, place, and time. He appears well-developed and well-nourished. No distress.  HENT:  Head: Normocephalic and atraumatic.  Right Ear: Hearing, tympanic membrane, external ear and ear canal normal.  Left Ear: Hearing, tympanic membrane, external ear and ear canal normal.  Nose: Nose normal.  Mouth/Throat: Uvula is midline, oropharynx is clear and moist and mucous membranes are normal. No oropharyngeal exudate,  posterior oropharyngeal edema or posterior oropharyngeal erythema.  Eyes: Conjunctivae and EOM are normal. Pupils are equal, round, and reactive to light. No scleral icterus.  Neck: Normal range of motion. Neck supple. No thyromegaly present.  Cardiovascular: Normal rate, regular rhythm, normal heart sounds and intact distal pulses.   No murmur heard. Pulses:      Radial pulses are 2+ on the right side, and 2+ on the left side.  Pulmonary/Chest: Effort normal and breath sounds normal. No respiratory distress. He has no wheezes. He has no rales.  Abdominal: Soft. Bowel sounds are normal. He exhibits no distension and no mass. There is no tenderness. There is no rebound and no guarding.  Musculoskeletal: Normal range of motion. He exhibits no edema.  Lymphadenopathy:    He has no cervical adenopathy.  Neurological: He is alert and oriented to person, place, and time.  CN grossly intact, station and gait intact  Skin: Skin is warm and dry. No rash noted.  Scattered AKs - R forearm, R cheek  Psychiatric: He has a normal mood and affect. His behavior is normal. Judgment and thought content normal.  Nursing note and vitals reviewed.  Results for orders placed or performed in visit on 11/11/14  CBC and differential  Result Value Ref Range   Hemoglobin 16.0 13.5 - 17.5 g/dL   Platelets 248 150 - 399 K/L   WBC 6.7 50^0/XF  Basic metabolic panel  Result Value Ref Range   Creatinine 0.8 0.6 - 1.3 mg/dL   Sodium 137 137 - 147 mmol/L      Assessment & Plan:   Problem List Items Addressed This Visit    HTN (hypertension)    Chronic, stable. Continue regimen of amlodipine $RemoveBefor'5mg'UHbaiWbJOIBt$  daily.    Relevant Medications      amLODIpine (NORVASC) tablet      fenofibrate (TRICOR) tablet      rosuvastatin (CRESTOR) tablet   HLD (hyperlipidemia)    Continue fibrate, statin, and fish oil. Reviewed #s with patient.    Relevant Medications      amLODIpine (NORVASC) tablet      fenofibrate (TRICOR) tablet        rosuvastatin (CRESTOR) tablet   HLA-B27 spondyloarthropathy (Chronic)    Followed by Estanislado Pandy. On sulfasalazine    Healthcare maintenance - Primary    Preventative protocols reviewed and updated unless pt declined. Discussed healthy diet and lifestyle.  Pt overdue for colonoscopy - advised he call Dr Blanch Media office to schedule.    Elevated PSA    Followed by urology.    AK (actinic keratosis)    Scattered AKs on skin - will refer to derm per pt request for skin screening.        Follow up plan: Return in about 1 year (around 11/16/2015), or as needed, for annual exam, prior fasting for blood work.

## 2014-11-15 NOTE — Assessment & Plan Note (Signed)
Preventative protocols reviewed and updated unless pt declined. Discussed healthy diet and lifestyle.  Pt overdue for colonoscopy - advised he call Dr Blanch Media office to schedule.

## 2014-11-15 NOTE — Assessment & Plan Note (Signed)
Followed by urology.   

## 2014-11-15 NOTE — Assessment & Plan Note (Signed)
Chronic, stable. Continue regimen of amlodipine 5mg  daily.

## 2014-11-15 NOTE — Addendum Note (Signed)
Addended by: Ria Bush on: 11/15/2014 08:13 AM   Modules accepted: Orders, SmartSet

## 2014-11-15 NOTE — Assessment & Plan Note (Signed)
Scattered AKs on skin - will refer to derm per pt request for skin screening.

## 2014-11-15 NOTE — Assessment & Plan Note (Signed)
Continue fibrate, statin, and fish oil. Reviewed #s with patient.

## 2014-11-24 ENCOUNTER — Encounter: Payer: Self-pay | Admitting: Family Medicine

## 2015-11-06 ENCOUNTER — Telehealth: Payer: Self-pay | Admitting: Family Medicine

## 2015-11-06 DIAGNOSIS — E785 Hyperlipidemia, unspecified: Secondary | ICD-10-CM

## 2015-11-06 DIAGNOSIS — I10 Essential (primary) hypertension: Secondary | ICD-10-CM

## 2015-11-06 DIAGNOSIS — R972 Elevated prostate specific antigen [PSA]: Secondary | ICD-10-CM

## 2015-11-06 NOTE — Telephone Encounter (Signed)
Pt would like to have his fasting CPE lab draw completed at [facility] in [place].  Pt would like to pick up the order at the front desk on 11/07/15 if possible.  Canceled lab appt on 11/20/14 / lt

## 2015-11-07 NOTE — Telephone Encounter (Signed)
order written and in Kim's box.

## 2015-11-07 NOTE — Telephone Encounter (Signed)
Message left notifying patient and orders placed up front for pick up.

## 2015-11-09 ENCOUNTER — Other Ambulatory Visit: Payer: Self-pay | Admitting: Family Medicine

## 2015-11-09 LAB — LIPID PANEL
CHOL/HDL RATIO: 3.9 ratio (ref <=5.0)
CHOLESTEROL: 147 mg/dL (ref 125–200)
HDL: 38 mg/dL — ABNORMAL LOW (ref >=40)
LDL Cholesterol: 90 mg/dL (ref <130)
TRIGLYCERIDES: 95 mg/dL (ref <150)
VLDL: 19 mg/dL (ref <30)

## 2015-11-09 LAB — COMPREHENSIVE METABOLIC PANEL
ALBUMIN: 4.2 g/dL (ref 3.6–5.1)
ALK PHOS: 57 U/L (ref 40–115)
ALT: 30 U/L (ref 9–46)
AST: 23 U/L (ref 10–35)
BUN: 11 mg/dL (ref 7–25)
CALCIUM: 9.3 mg/dL (ref 8.6–10.3)
CHLORIDE: 106 mmol/L (ref 98–110)
CO2: 20 mmol/L (ref 20–31)
Creat: 0.79 mg/dL (ref 0.70–1.25)
Glucose, Bld: 117 mg/dL — ABNORMAL HIGH (ref 65–99)
POTASSIUM: 3.9 mmol/L (ref 3.5–5.3)
Sodium: 137 mmol/L (ref 135–146)
TOTAL PROTEIN: 6.8 g/dL (ref 6.1–8.1)
Total Bilirubin: 0.7 mg/dL (ref 0.2–1.2)

## 2015-11-10 LAB — PSA, FREE
PSA, Free Pct: 16 % — ABNORMAL LOW (ref >25)
PSA, Free: 0.74 ng/mL

## 2015-11-10 LAB — PSA, SERIAL: PSA: 4.64 ng/mL — ABNORMAL HIGH (ref <=4.00)

## 2015-11-13 ENCOUNTER — Telehealth: Payer: Self-pay | Admitting: Family Medicine

## 2015-11-13 NOTE — Telephone Encounter (Signed)
Pt called again asking for results because he would like to share them with another provider he is seeing. Best number to reach pt is [phone].

## 2015-11-13 NOTE — Telephone Encounter (Signed)
Spoke with patient. He requested labs be sent to Dr. [name] at [facility] and to Dr. Estanislado Pandy at [facility]. Faxed as requested.

## 2015-11-13 NOTE — Telephone Encounter (Signed)
Pt called checking on lab results  It had this done @ [facility] last week

## 2015-11-14 NOTE — Telephone Encounter (Signed)
Pt called and said that [facility] did not receive the labs that were sent over yesterday. [facility] did.

## 2015-11-14 NOTE — Telephone Encounter (Signed)
Refaxed to [facility].

## 2015-11-16 ENCOUNTER — Other Ambulatory Visit (HOSPITAL_COMMUNITY): Payer: Self-pay | Admitting: Urology

## 2015-11-16 DIAGNOSIS — R972 Elevated prostate specific antigen [PSA]: Secondary | ICD-10-CM

## 2015-11-19 ENCOUNTER — Other Ambulatory Visit: Payer: Self-pay | Admitting: Family Medicine

## 2015-11-20 ENCOUNTER — Encounter: Payer: Self-pay | Admitting: Family Medicine

## 2015-11-21 ENCOUNTER — Other Ambulatory Visit: Payer: PRIVATE HEALTH INSURANCE

## 2015-11-28 ENCOUNTER — Encounter: Payer: Self-pay | Admitting: Family Medicine

## 2015-11-28 ENCOUNTER — Ambulatory Visit (INDEPENDENT_AMBULATORY_CARE_PROVIDER_SITE_OTHER): Payer: PRIVATE HEALTH INSURANCE | Admitting: Family Medicine

## 2015-11-28 VITALS — BP 138/80 | HR 76 | Temp 98.2°F | Wt 192.8 lb

## 2015-11-28 DIAGNOSIS — I10 Essential (primary) hypertension: Secondary | ICD-10-CM

## 2015-11-28 DIAGNOSIS — E785 Hyperlipidemia, unspecified: Secondary | ICD-10-CM

## 2015-11-28 DIAGNOSIS — Z0000 Encounter for general adult medical examination without abnormal findings: Secondary | ICD-10-CM | POA: Diagnosis not present

## 2015-11-28 DIAGNOSIS — R972 Elevated prostate specific antigen [PSA]: Secondary | ICD-10-CM

## 2015-11-28 MED ORDER — ROSUVASTATIN CALCIUM 40 MG PO TABS: 40.0000 mg | ORAL_TABLET | Freq: Every day | ORAL | Status: DC

## 2015-11-28 MED ORDER — FENOFIBRATE 145 MG PO TABS: 145.0000 mg | ORAL_TABLET | Freq: Every day | ORAL | Status: DC

## 2015-11-28 NOTE — Assessment & Plan Note (Signed)
Chronic, stable. Continue current regimen (statin, fibrate, fish oil).

## 2015-11-28 NOTE — Assessment & Plan Note (Signed)
Appreciate urology care of patient. Pending MRI and rpt biopsy for elevated PSA.

## 2015-11-28 NOTE — Assessment & Plan Note (Signed)
Chronic stable. Continue current regimen. 

## 2015-11-28 NOTE — Patient Instructions (Addendum)
We will call you to schedule colonoscopy. Call your insurance about the shingles shot to see if it is covered or how much it would cost and where is cheaper (here or pharmacy).  If you want to receive here, call for nurse visit.  Return as needed or in 1 year for next physical.      Mediterranean Diet  Why follow it? Research shows. . Those who follow the Mediterranean diet have a reduced risk of heart disease  . The diet is associated with a reduced incidence of Parkinson's and Alzheimer's diseases . People following the diet may have longer life expectancies and lower rates of chronic diseases  . The Dietary Guidelines for Americans recommends the Mediterranean diet as an eating plan to promote health and prevent disease  What Is the Mediterranean Diet?  . Healthy eating plan based on typical foods and recipes of Mediterranean-style cooking . The diet is primarily a plant based diet; these foods should make up a majority of meals   Starches - Plant based foods should make up a majority of meals - They are an important sources of vitamins, minerals, energy, antioxidants, and fiber - Choose whole grains, foods high in fiber and minimally processed items  - Typical grain sources include wheat, oats, barley, corn, brown rice, bulgar, farro, millet, polenta, couscous  - Various types of beans include chickpeas, lentils, fava beans, black beans, white beans   Fruits  Veggies - Large quantities of antioxidant rich fruits & veggies; 6 or more servings  - Vegetables can be eaten raw or lightly drizzled with oil and cooked  - Vegetables common to the traditional Mediterranean Diet include: artichokes, arugula, beets, broccoli, brussel sprouts, cabbage, carrots, celery, collard greens, cucumbers, eggplant, kale, leeks, lemons, lettuce, mushrooms, okra, onions, peas, peppers, potatoes, pumpkin, radishes, rutabaga, shallots, spinach, sweet potatoes, turnips, zucchini - Fruits common to the Mediterranean  Diet include: apples, apricots, avocados, cherries, clementines, dates, figs, grapefruits, grapes, melons, nectarines, oranges, peaches, pears, pomegranates, strawberries, tangerines  Fats - Replace butter and margarine with healthy oils, such as olive oil, canola oil, and tahini  - Limit nuts to no more than a handful a day  - Nuts include walnuts, almonds, pecans, pistachios, pine nuts  - Limit or avoid candied, honey roasted or heavily salted nuts - Olives are central to the [employer] - can be eaten whole or used in a variety of dishes   Meats Protein - Limiting red meat: no more than a few times a month - When eating red meat: choose lean cuts and keep the portion to the size of deck of cards - Eggs: approx. 0 to 4 times a week  - Fish and lean poultry: at least 2 a week  - Healthy protein sources include, chicken, [place], lean beef, lamb - Increase intake of seafood such as tuna, salmon, trout, mackerel, shrimp, scallops - Avoid or limit high fat processed meats such as sausage and bacon  Dairy - Include moderate amounts of low fat dairy products  - Focus on healthy dairy such as fat free yogurt, skim milk, low or reduced fat cheese - Limit dairy products higher in fat such as whole or 2% milk, cheese, ice cream  Alcohol - Moderate amounts of red wine is ok  - No more than 5 oz daily for women (all ages) and men older than age 53  - No more than 10 oz of wine daily for men younger than 41  Other - Limit sweets  and other desserts  - Use herbs and spices instead of salt to flavor foods  - Herbs and spices common to the traditional Mediterranean Diet include: basil, bay leaves, chives, cloves, cumin, fennel, garlic, lavender, marjoram, mint, oregano, parsley, pepper, rosemary, sage, savory, sumac, tarragon, thyme   It's not just a diet, it's a lifestyle:  . The Mediterranean diet includes lifestyle factors typical of those in the region  . Foods, drinks and meals are best eaten  with others and savored . Daily physical activity is important for overall good health . This could be strenuous exercise like running and aerobics . This could also be more leisurely activities such as walking, housework, yard-work, or taking the stairs . Moderation is the key; a balanced and healthy diet accommodates most foods and drinks . Consider portion sizes and frequency of consumption of certain foods   Meal Ideas & Options:  . Breakfast:  o Whole wheat toast or whole wheat English muffins with peanut butter & hard boiled egg o Steel cut oats topped with apples & cinnamon and skim milk  o Fresh fruit: banana, strawberries, melon, berries, peaches  o Smoothies: strawberries, bananas, greek yogurt, peanut butter o Low fat greek yogurt with blueberries and granola  o Egg white omelet with spinach and mushrooms o Breakfast couscous: whole wheat couscous, apricots, skim milk, cranberries  . Sandwiches:  o Hummus and grilled vegetables (peppers, zucchini, squash) on whole wheat bread   o Grilled chicken on whole wheat pita with lettuce, tomatoes, cucumbers or tzatziki  o Tuna salad on whole wheat bread: tuna salad made with greek yogurt, olives, red peppers, capers, green onions o Garlic rosemary lamb pita: lamb sauted with garlic, rosemary, salt & pepper; add lettuce, cucumber, greek yogurt to pita - flavor with lemon juice and black pepper  . Seafood:  o Mediterranean grilled salmon, seasoned with garlic, basil, parsley, lemon juice and black pepper o Shrimp, lemon, and spinach whole-grain pasta salad made with low fat greek yogurt  o Seared scallops with lemon orzo  o Seared tuna steaks seasoned salt, pepper, coriander topped with tomato mixture of olives, tomatoes, olive oil, minced garlic, parsley, green onions and cappers  . Meats:  o Herbed greek chicken salad with kalamata olives, cucumber, feta  o Red bell peppers stuffed with spinach, bulgur, lean ground beef (or lentils) &  topped with feta   o Kebabs: skewers of chicken, tomatoes, onions, zucchini, squash  o [place] burgers: made with red onions, mint, dill, lemon juice, feta cheese topped with roasted red peppers . Vegetarian o Cucumber salad: cucumbers, artichoke hearts, celery, red onion, feta cheese, tossed in olive oil & lemon juice  o Hummus and whole grain pita points with a greek salad (lettuce, tomato, feta, olives, cucumbers, red onion) o Lentil soup with celery, carrots made with vegetable broth, garlic, salt and pepper  o Tabouli salad: parsley, bulgur, mint, scallions, cucumbers, tomato, radishes, lemon juice, olive oil, salt and pepper.

## 2015-11-28 NOTE — Assessment & Plan Note (Signed)
Preventative protocols reviewed and updated unless pt declined. Discussed healthy diet and lifestyle.  

## 2015-11-28 NOTE — Progress Notes (Signed)
BP 138/80 mmHg  Pulse 76  Temp(Src) 98.2 F (36.8 C) (Oral)  Wt 192 lb 12 oz (87.431 kg)   CC: CPE  Subjective:    Patient ID: Dylan Carter, male    DOB: [birth year], 65 y.o.   MRN: [id number]  HPI: Dylan Carter is a 65 y.o. male presenting on 11/28/2015 for Annual Exam   Preventative: Colonoscopy done 2008 (Dr. Henrene Pastor). Due for rpt - requests we schedule f/u.  Elevated PSA Date: 08/2013 Dec 2014- 12 core, atypia at L apex, Jun 2015- 14 core biopsy, atypia at L apex; Jan 2017 - new nodule pending MRI and rpt biopsy [name]) Flu shot yearly at work Tdap 11/2013 Shingles shot - will check with insurance  Sunscreen use discussed. Tries to wear hat.  Seat belt use discussed.   Lives with wife, has grown children  Occupation: delivery of parts for [employer]  Activity: maintains property, gardening, occasional walking  Diet: some water, fruits/vegetables daily   Relevant past medical, surgical, family and social history reviewed and updated as indicated. Interim medical history since our last visit reviewed. Allergies and medications reviewed and updated. Current Outpatient Prescriptions on File Prior to Visit  Medication Sig  . amLODipine (NORVASC) 5 MG tablet Take 1 tablet (5 mg total) by mouth daily.  Marland Kitchen aspirin 81 MG tablet Take 81 mg by mouth daily.    . cholecalciferol (VITAMIN D) 1000 UNITS tablet Take 1,000 Units by mouth daily.  . fish oil-omega-3 fatty acids 1000 MG capsule Take 2 g by mouth daily.    . Glucosamine-Chondroit-Vit C-Mn (GLUCOSAMINE 1500 COMPLEX PO) Take by mouth 2 (two) times daily.  Marland Kitchen loratadine (CLARITIN) 10 MG tablet Take 10 mg by mouth daily.    Marland Kitchen Lysine 500 MG TABS Take 1 tablet by mouth daily.    . Multiple Vitamins-Minerals (CENTRUM PO) Take 1 tablet by mouth daily.    Marland Kitchen sulfaSALAzine (AZULFIDINE) 500 MG tablet Take 500 mg by mouth 2 (two) times daily.    . vitamin C (ASCORBIC ACID) 500 MG tablet Take 500 mg by mouth daily.    . vitamin E 400  UNIT capsule Take 400 Units by mouth daily.     No current facility-administered medications on file prior to visit.    Review of Systems  Constitutional: Negative for fever, chills, activity change, appetite change, fatigue and unexpected weight change.  HENT: Negative for hearing loss.   Eyes: Negative for visual disturbance.  Respiratory: Negative for cough, chest tightness, shortness of breath and wheezing.   Cardiovascular: Negative for chest pain, palpitations and leg swelling.  Gastrointestinal: Negative for nausea, vomiting, abdominal pain, diarrhea, constipation, blood in stool and abdominal distention.  Genitourinary: Negative for hematuria and difficulty urinating.  Musculoskeletal: Negative for myalgias, arthralgias and neck pain.  Skin: Negative for rash.  Neurological: Negative for dizziness, seizures, syncope and headaches.  Hematological: Negative for adenopathy. Does not bruise/bleed easily.  Psychiatric/Behavioral: Negative for dysphoric mood. The patient is not nervous/anxious.    Per HPI unless specifically indicated in ROS section     Objective:    BP 138/80 mmHg  Pulse 76  Temp(Src) 98.2 F (36.8 C) (Oral)  Wt 192 lb 12 oz (87.431 kg)  Wt Readings from Last 3 Encounters:  11/28/15 192 lb 12 oz (87.431 kg)  11/15/14 193 lb 4 oz (87.658 kg)  11/18/13 203 lb 8 oz (92.307 kg)    Physical Exam  Constitutional: He is oriented to person, place, and time. He  appears well-developed and well-nourished. No distress.  HENT:  Head: Normocephalic and atraumatic.  Right Ear: Hearing, tympanic membrane, external ear and ear canal normal.  Left Ear: Hearing, tympanic membrane, external ear and ear canal normal.  Nose: Nose normal.  Mouth/Throat: Uvula is midline, oropharynx is clear and moist and mucous membranes are normal. No oropharyngeal exudate, posterior oropharyngeal edema or posterior oropharyngeal erythema.  Eyes: Conjunctivae and EOM are normal. Pupils are  equal, round, and reactive to light. No scleral icterus.  Neck: Normal range of motion. Neck supple. Carotid bruit is not present. No thyromegaly present.  Cardiovascular: Normal rate, regular rhythm, normal heart sounds and intact distal pulses.   No murmur heard. Pulses:      Radial pulses are 2+ on the right side, and 2+ on the left side.  Pulmonary/Chest: Effort normal and breath sounds normal. No respiratory distress. He has no wheezes. He has no rales.  Abdominal: Soft. Bowel sounds are normal. He exhibits no distension and no mass. There is no tenderness. There is no rebound and no guarding.  Musculoskeletal: Normal range of motion. He exhibits no edema.  Lymphadenopathy:    He has no cervical adenopathy.  Neurological: He is alert and oriented to person, place, and time.  CN grossly intact, station and gait intact  Skin: Skin is warm and dry. No rash noted.  Psychiatric: He has a normal mood and affect. His behavior is normal. Judgment and thought content normal.  Nursing note and vitals reviewed.  Results for orders placed or performed in visit on 11/09/15  Comprehensive metabolic panel  Result Value Ref Range   Sodium 137 135 - 146 mmol/L   Potassium 3.9 3.5 - 5.3 mmol/L   Chloride 106 98 - 110 mmol/L   CO2 20 20 - 31 mmol/L   Glucose, Bld 117 (H) 65 - 99 mg/dL   BUN 11 7 - 25 mg/dL   Creat 0.79 0.70 - 1.25 mg/dL   Total Bilirubin 0.7 0.2 - 1.2 mg/dL   Alkaline Phosphatase 57 40 - 115 U/L   AST 23 10 - 35 U/L   ALT 30 9 - 46 U/L   Total Protein 6.8 6.1 - 8.1 g/dL   Albumin 4.2 3.6 - 5.1 g/dL   Calcium 9.3 8.6 - 10.3 mg/dL  Lipid panel  Result Value Ref Range   Cholesterol 147 125 - 200 mg/dL   Triglycerides 95 <150 mg/dL   HDL 38 (L) >=40 mg/dL   Total CHOL/HDL Ratio 3.9 <=5.0 Ratio   VLDL 19 <30 mg/dL   LDL Cholesterol 90 <130 mg/dL  PSA, serial  Result Value Ref Range   PSA 4.64 (H) <=4.00 ng/mL  PSA, free  Result Value Ref Range   PSA, Free 0.74 ng/mL    PSA, Free Pct 16 (L) >25 %      Assessment & Plan:  Hep C screen next visit. Problem List Items Addressed This Visit    HTN (hypertension)    Chronic stable. Continue current regimen.      Relevant Medications   rosuvastatin (CRESTOR) 40 MG tablet   fenofibrate (TRICOR) 145 MG tablet   HLD (hyperlipidemia)    Chronic, stable. Continue current regimen (statin, fibrate, fish oil).      Relevant Medications   rosuvastatin (CRESTOR) 40 MG tablet   fenofibrate (TRICOR) 145 MG tablet   Healthcare maintenance - Primary    Preventative protocols reviewed and updated unless pt declined. Discussed healthy diet and lifestyle.  Elevated PSA    Appreciate urology care of patient. Pending MRI and rpt biopsy for elevated PSA.          Follow up plan: Return in about 1 year (around 11/27/2016), or as needed, for annual exam, prior fasting for blood work.

## 2015-11-28 NOTE — Progress Notes (Signed)
Pre visit review using our clinic review tool, if applicable. No additional management support is needed unless otherwise documented below in the visit note. 

## 2015-12-07 ENCOUNTER — Ambulatory Visit (HOSPITAL_COMMUNITY)
Admission: RE | Admit: 2015-12-07 | Discharge: 2015-12-07 | Disposition: A | Discharge status: Home / Self Care | Payer: PRIVATE HEALTH INSURANCE | Source: Ambulatory Visit | Attending: Urology | Admitting: Urology

## 2015-12-07 DIAGNOSIS — R972 Elevated prostate specific antigen [PSA]: Secondary | ICD-10-CM | POA: Diagnosis not present

## 2015-12-07 DIAGNOSIS — N400 Enlarged prostate without lower urinary tract symptoms: Secondary | ICD-10-CM | POA: Insufficient documentation

## 2015-12-07 MED ORDER — GADOBENATE DIMEGLUMINE 529 MG/ML IV SOLN
20.0000 mL | Freq: Once | INTRAVENOUS | Status: AC | PRN
  Administered 2015-12-07: 18 mL via INTRAVENOUS

## 2016-01-12 ENCOUNTER — Other Ambulatory Visit: Payer: Self-pay | Admitting: Family Medicine

## 2016-05-29 IMAGING — MR MR PROSTATE WO/W CM
23 of 53 series · 23 of 53 positions shown · IV contrast (yes)
Comparison: 04/17/2014 biopsy.

CLINICAL DATA: Elevated PSA.  Prior negative biopsy.

EXAM:
MR PROSTATE WITHOUT AND WITH CONTRAST
TECHNIQUE: Multiplanar multisequence MRI images were obtained of the pelvis
centered about the prostate. Pre and post contrast images were
obtained.
CONTRAST:  18mL MULTIHANCE GADOBENATE DIMEGLUMINE 529 MG/ML IV SOLN

[Series 3: bSSFP fat-sat · axial · 6.0mm · 0.86mm/px · 1 of 44 slices shown]
[im 1/44]
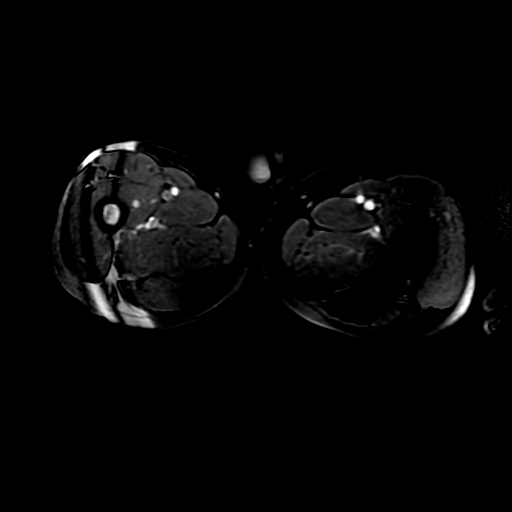

[Series 4: T1 · axial · 8.0mm · 0.70mm/px · 1 of 30 slices shown (1 of 2)]
[im 1/30]
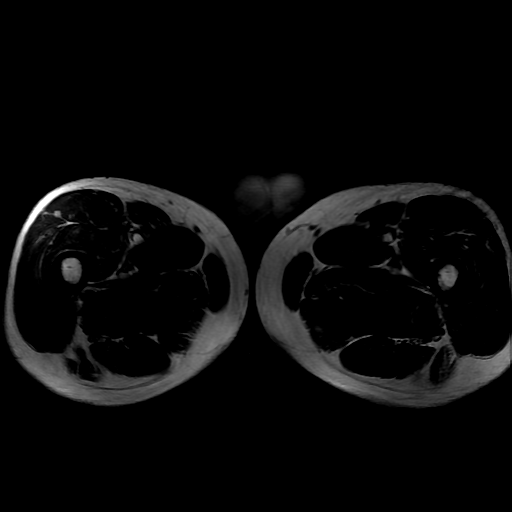

[Series 5: T2 · axial · 3.0mm · 0.29mm/px · 1 of 36 slices shown (1 of 3)]
[im 1/36]
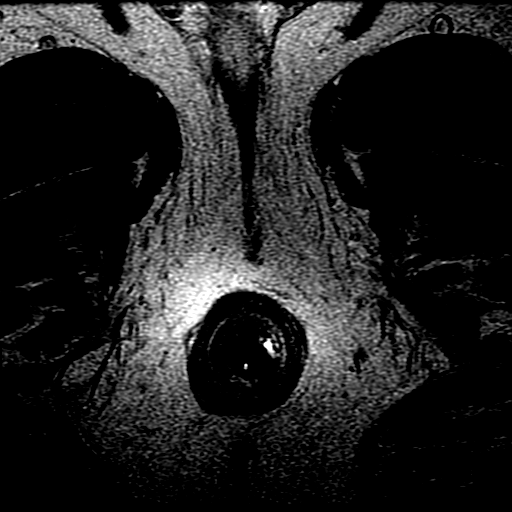

[Series 6: T1 · axial · 3.0mm · 0.29mm/px · 1 of 36 slices shown (2 of 2)]
[im 1/36]
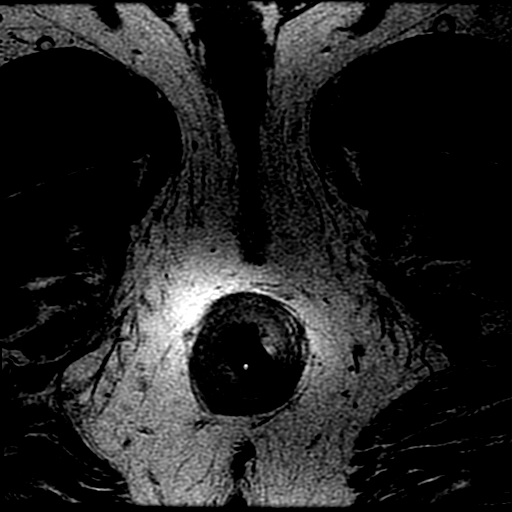

[Series 7: T2 · sagittal · 4.0mm · 0.29mm/px · 1 of 25 slices shown (2 of 3)]
[im 1/25]
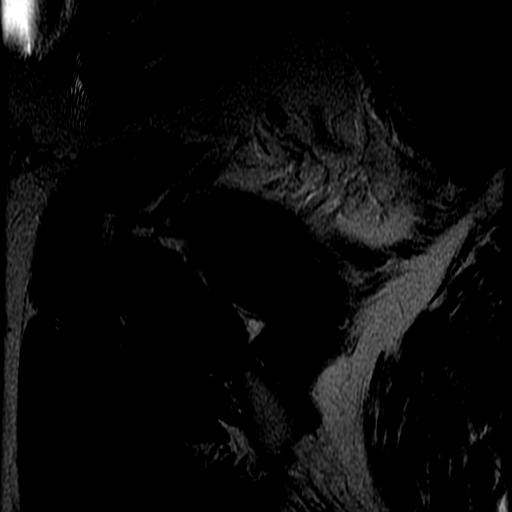

[Series 8: T2 · coronal · 4.0mm · 0.29mm/px · 1 of 24 slices shown (3 of 3)]
[im 1/24]
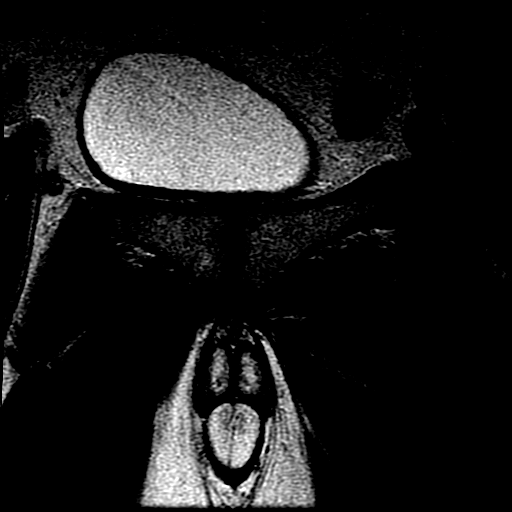

[Series 9: DWI · axial · 3.0mm · 0.59mm/px · 1 of 56 slices shown (1 of 6)]
[im 1/56]
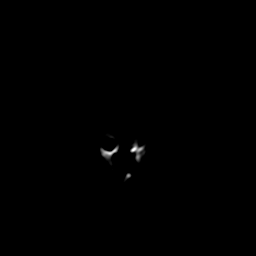

[Series 10: DWI · axial · 3.0mm · 0.59mm/px · 1 of 56 slices shown (2 of 6)]
[im 1/56]
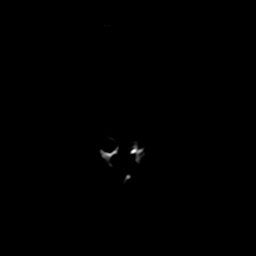

[Series 11: DWI · axial · 3.0mm · 0.59mm/px · 1 of 56 slices shown (3 of 6)]
[im 1/56]
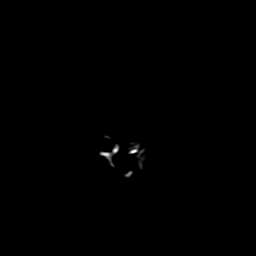

[Series 900: DWI · axial · 3.0mm · 0.59mm/px · 1 of 28 slices shown (4 of 6)]
[im 1/28]
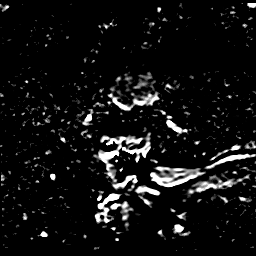

[Series 1000: DWI · axial · 3.0mm · 0.59mm/px · 1 of 28 slices shown (5 of 6)]
[im 1/28]
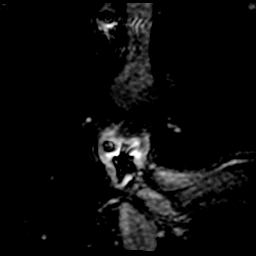

[Series 1100: DWI · axial · 3.0mm · 0.59mm/px · 1 of 28 slices shown (6 of 6)]
[im 1/28]
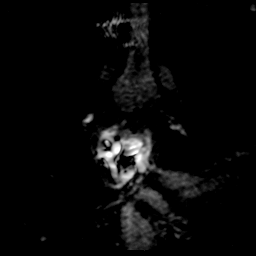

[((id)/(id)/1)-((id)/(id)/1) · axial · 3.0mm · 0.43mm/px · 1 of 83 slices shown (1 of 11)]
[im 1/83]
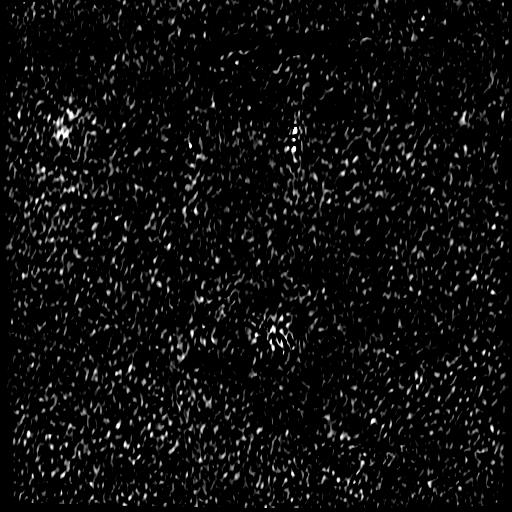

[((id)/(id)/1)-((id)/(id)/1) · axial · 3.0mm · 0.43mm/px · 1 of 80 slices shown (2 of 11)]
[im 1/80]
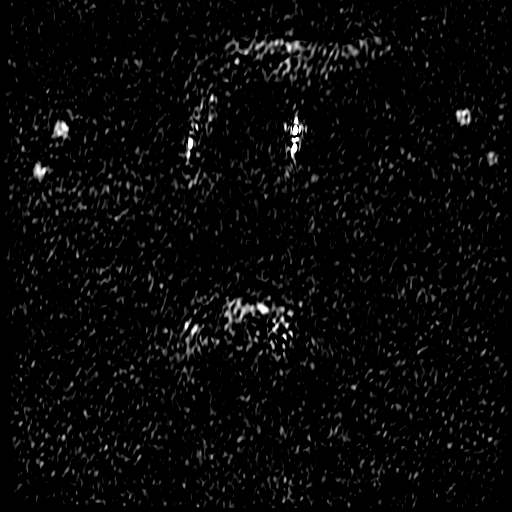

[((id)/(id)/1)-((id)/(id)/1) · axial · 3.0mm · 0.43mm/px · 1 of 84 slices shown (3 of 11)]
[im 1/84]
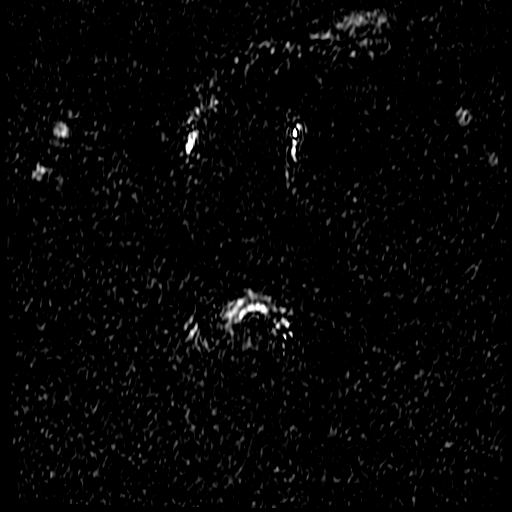

[((id)/(id)/1)-((id)/(id)/1) · axial · 3.0mm · 0.43mm/px · 1 of 84 slices shown (4 of 11)]
[im 1/84]
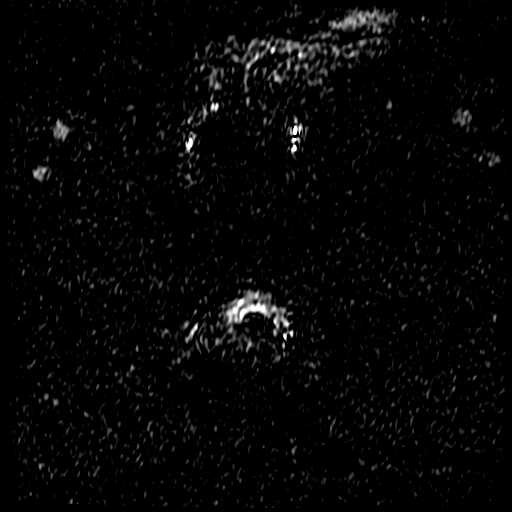

[((id)/(id)/1)-((id)/(id)/1) · axial · 3.0mm · 0.43mm/px · 1 of 83 slices shown (5 of 11)]
[im 1/83]
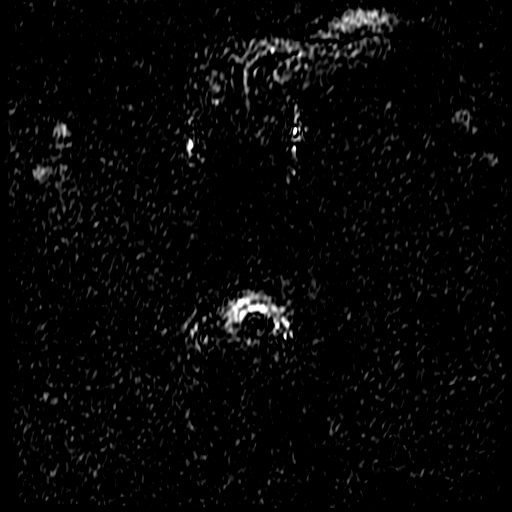

[((id)/(id)/1)-((id)/(id)/1) · axial · 3.0mm · 0.43mm/px · 1 of 84 slices shown (6 of 11)]
[im 1/84]
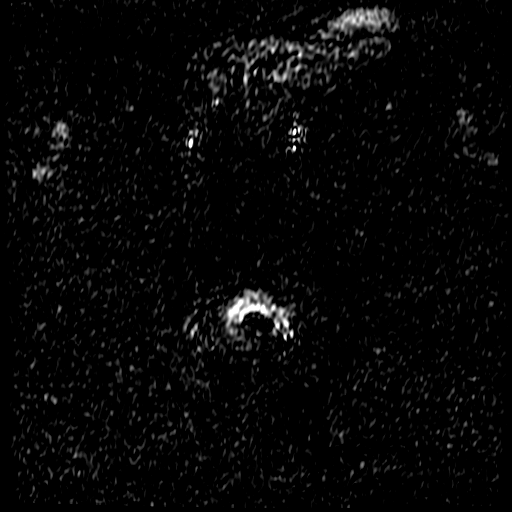

[((id)/(id)/1)-((id)/(id)/1) · axial · 3.0mm · 0.43mm/px · 1 of 84 slices shown (7 of 11)]
[im 1/84]
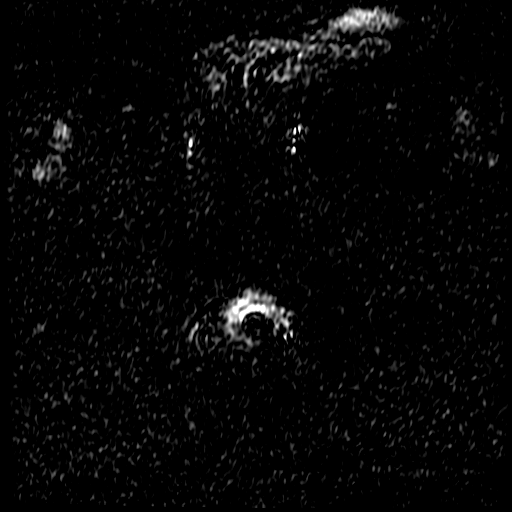

[((id)/(id)/1)-((id)/(id)/1) · axial · 3.0mm · 0.43mm/px · 1 of 83 slices shown (8 of 11)]
[im 1/83]
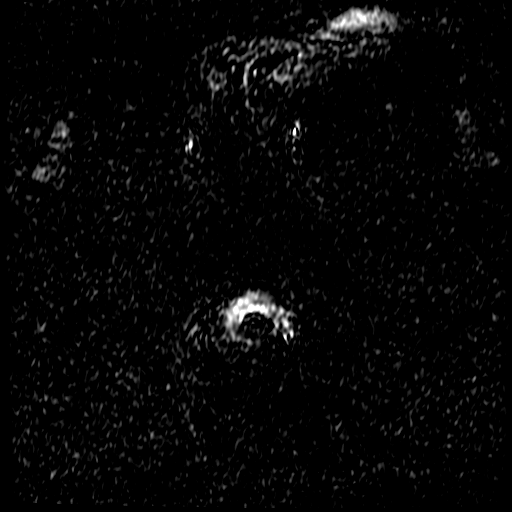

[((id)/(id)/1)-((id)/(id)/1) · axial · 3.0mm · 0.43mm/px · 1 of 84 slices shown (9 of 11)]
[im 1/84]
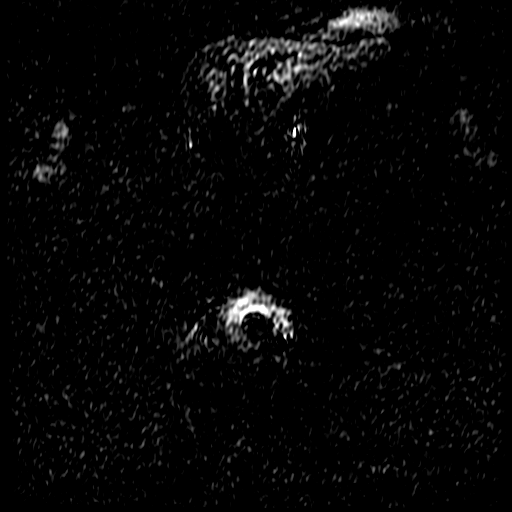

[((id)/(id)/1)-((id)/(id)/1) · axial · 3.0mm · 0.43mm/px · 1 of 84 slices shown (10 of 11)]
[im 1/84]
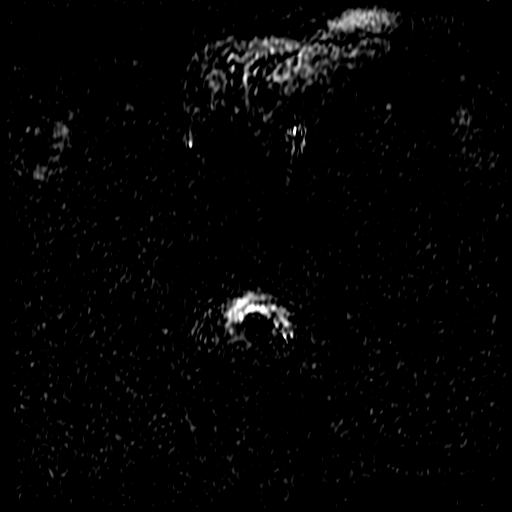

[((id)/(id)/1)-((id)/(id)/1) · axial · 3.0mm · 0.43mm/px · 1 of 84 slices shown (11 of 11)]
[im 1/84]
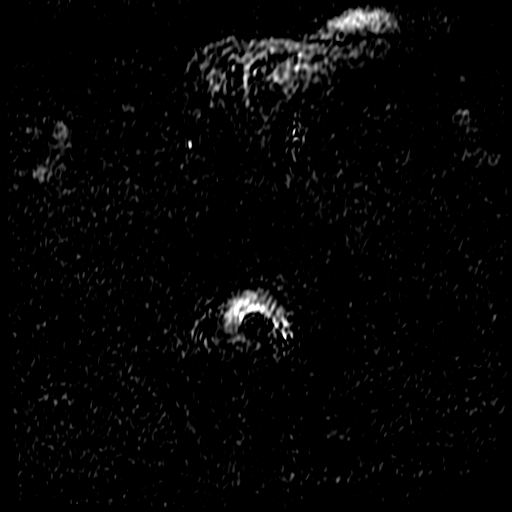

[23 of 53 positions shown; findings below may reference images not displayed]

FINDINGS: Prostate: Demonstrates mild to moderate central gland enlargement
and heterogeneity, consistent with benign prostatic hyperplasia.
Mildly prominent median lobe, eccentric left, indenting the urinary
bladder.

The peripheral zone is relatively diffusely heterogeneously T2 hypo
intense. T2 hypo intensity spares the apex minimally. No dominant
mass like areas. No areas of restricted diffusion or early
post-contrast enhancement identified.

Transcapsular spread:  Absent

Seminal vesicle involvement: Absent

Neurovascular bundle involvement: Absent

Pelvic adenopathy: Absent

Bone metastasis: Absent

Other findings: Normal urinary bladder. No free fluid. Normal pelvic
bowel loops.
IMPRESSION: 1. Nonspecific heterogeneous T2 hypointensity relatively diffusely
throughout the peripheral zone. Although this decreases sensitivity
for focal T2 abnormalities, no dominant or masslike areas are
identified. Further, no areas of restricted diffusion to suggest
higher grade disease.
2.  No evidence of locally advanced or pelvic metastatic disease.

## 2016-07-19 ENCOUNTER — Encounter: Payer: Self-pay | Admitting: Family Medicine

## 2016-11-23 ENCOUNTER — Other Ambulatory Visit: Payer: Self-pay | Admitting: Family Medicine

## 2016-11-23 DIAGNOSIS — C61 Malignant neoplasm of prostate: Secondary | ICD-10-CM

## 2016-11-23 DIAGNOSIS — E785 Hyperlipidemia, unspecified: Secondary | ICD-10-CM

## 2016-11-24 ENCOUNTER — Telehealth: Payer: Self-pay | Admitting: Family Medicine

## 2016-11-24 NOTE — Telephone Encounter (Signed)
Order requisition in system - plz print and fax to pt as below.

## 2016-11-24 NOTE — Telephone Encounter (Signed)
Pt states that he didn't get the fax. Pt is asking to refax to the same number thanks

## 2016-11-24 NOTE — Telephone Encounter (Signed)
Patient is coming in for a physical on 12/10/16.  Patient is asking for a lab order to be faxed to [facility]. The fax number is [phone]- patient said it is a secure fax.

## 2016-11-24 NOTE — Telephone Encounter (Signed)
Re-faxed.

## 2016-11-24 NOTE — Telephone Encounter (Signed)
Order faxed as requested

## 2016-11-26 ENCOUNTER — Other Ambulatory Visit: Payer: PRIVATE HEALTH INSURANCE

## 2016-12-03 ENCOUNTER — Encounter: Payer: PRIVATE HEALTH INSURANCE | Admitting: Family Medicine

## 2016-12-04 LAB — LIPID PANEL
Cholesterol: 154 mg/dL (ref 0–200)
HDL: 35 mg/dL (ref 35–70)
LDL Cholesterol: 96 mg/dL
LDL/HDL RATIO: 4.4
TRIGLYCERIDES: 136 mg/dL (ref 40–160)

## 2016-12-04 LAB — BASIC METABOLIC PANEL
BUN: 10 mg/dL (ref 4–21)
Creatinine: 0.9 mg/dL (ref 0.6–1.3)
Glucose: 116 mg/dL
POTASSIUM: 4.5 mmol/L (ref 3.4–5.3)
SODIUM: 140 mmol/L (ref 137–147)

## 2016-12-04 LAB — PSA: PSA: 3.9

## 2016-12-04 LAB — HEPATIC FUNCTION PANEL
ALK PHOS: 56 U/L (ref 25–125)
ALT: 30 U/L (ref 10–40)
AST: 26 U/L (ref 14–40)
Bilirubin, Total: 0.5 mg/dL

## 2016-12-04 LAB — CBC AND DIFFERENTIAL
HEMATOCRIT: 44 % (ref 41–53)
HEMOGLOBIN: 14.7 g/dL (ref 13.5–17.5)
Neutrophils Absolute: 52 /uL
Platelets: 223 10*3/uL (ref 150–399)
WBC: 5.2 10^3/mL

## 2016-12-10 ENCOUNTER — Encounter: Payer: Self-pay | Admitting: Family Medicine

## 2016-12-10 ENCOUNTER — Telehealth: Payer: Self-pay | Admitting: Family Medicine

## 2016-12-10 ENCOUNTER — Ambulatory Visit (INDEPENDENT_AMBULATORY_CARE_PROVIDER_SITE_OTHER): Payer: PRIVATE HEALTH INSURANCE | Admitting: Family Medicine

## 2016-12-10 VITALS — BP 158/88 | HR 69 | Temp 98.7°F | Ht 69.0 in | Wt 189.5 lb

## 2016-12-10 DIAGNOSIS — I10 Essential (primary) hypertension: Secondary | ICD-10-CM

## 2016-12-10 DIAGNOSIS — E785 Hyperlipidemia, unspecified: Secondary | ICD-10-CM

## 2016-12-10 DIAGNOSIS — Z0000 Encounter for general adult medical examination without abnormal findings: Secondary | ICD-10-CM

## 2016-12-10 DIAGNOSIS — M47899 Other spondylosis, site unspecified: Secondary | ICD-10-CM

## 2016-12-10 DIAGNOSIS — Z1211 Encounter for screening for malignant neoplasm of colon: Secondary | ICD-10-CM | POA: Diagnosis not present

## 2016-12-10 DIAGNOSIS — Z23 Encounter for immunization: Secondary | ICD-10-CM

## 2016-12-10 DIAGNOSIS — R011 Cardiac murmur, unspecified: Secondary | ICD-10-CM

## 2016-12-10 DIAGNOSIS — C61 Malignant neoplasm of prostate: Secondary | ICD-10-CM

## 2016-12-10 MED ORDER — PNEUMOCOCCAL 13-VAL CONJ VACC IM SUSP
0.5000 mL | Freq: Once | INTRAMUSCULAR | Status: AC
  Administered 2016-12-10: 0.5 mL via INTRAMUSCULAR

## 2016-12-10 NOTE — Assessment & Plan Note (Addendum)
Longstanding, sounds stable

## 2016-12-10 NOTE — Telephone Encounter (Signed)
Patient called to find out if Dr.Gutierrez received the lab report from [place].  Please call patient.

## 2016-12-10 NOTE — Assessment & Plan Note (Signed)
Chronic, stable. Continue current regimen. 

## 2016-12-10 NOTE — Progress Notes (Signed)
BP (!) 158/88 (BP Location: Right Arm, Cuff Size: Large)   Pulse 69   Temp 98.7 F (37.1 C) (Oral)   Ht _0  (1.753 m)   Wt 189 lb 8 oz (86 kg)   SpO2 97%   BMI 27.98 kg/m    CC: CPE Subjective:    Patient ID: Dylan Carter, male    DOB: Jun 29, 1951, 66 y.o.   MRN: [id number]  HPI: Dylan Carter is a 66 y.o. male presenting on 12/10/2016 for Annual Exam   HTN and HLD and spondyloarthropathy - on sulfasalazine.  Had labwork at [facility] - we never received labs - will request today.   Preventative: Colonoscopy done 2008 (Dr. Henrene Carter). Due for rpt - requests we schedule f/u.  Prostate cancer Jan 2017 - active surveillance Dylan Carter).  Flu shot yearly at work Tdap 11/2013 Prevnar today Shingles shot - will check with insurance  Sunscreen use discussed. No changing moles on skin. Seat belt use discussed.  Non smoker Alcohol - none  4-5 cups coffee.  Lives with wife, has grown children  Occupation: delivery of parts for [employer]  Activity: maintains property, gardening, walking at work  Diet: some water, fruits/vegetables daily   Relevant past medical, surgical, family and social history reviewed and updated as indicated. Interim medical history since our last visit reviewed. Allergies and medications reviewed and updated. Current Outpatient Prescriptions on File Prior to Visit  Medication Sig  . amLODipine (NORVASC) 5 MG tablet TAKE 1 TABLET BY MOUTH  DAILY  . aspirin 81 MG tablet Take 81 mg by mouth daily.    . cholecalciferol (VITAMIN D) 1000 UNITS tablet Take 1,000 Units by mouth daily.  . fenofibrate (TRICOR) 145 MG tablet Take 1 tablet (145 mg total) by mouth at bedtime.  . fish oil-omega-3 fatty acids 1000 MG capsule Take 2 g by mouth daily.    . Glucosamine-Chondroit-Vit C-Mn (GLUCOSAMINE 1500 COMPLEX PO) Take by mouth 2 (two) times daily.  Marland Kitchen loratadine (CLARITIN) 10 MG tablet Take 10 mg by mouth daily.    Marland Kitchen Lysine 500 MG TABS Take 1 tablet by mouth daily.      . Multiple Vitamins-Minerals (CENTRUM PO) Take 1 tablet by mouth daily.    . rosuvastatin (CRESTOR) 40 MG tablet Take 1 tablet (40 mg total) by mouth at bedtime.  . sulfaSALAzine (AZULFIDINE) 500 MG tablet Take 500 mg by mouth 2 (two) times daily.    . vitamin C (ASCORBIC ACID) 500 MG tablet Take 500 mg by mouth daily.    . vitamin E 400 UNIT capsule Take 400 Units by mouth daily.     No current facility-administered medications on file prior to visit.     Review of Systems  Constitutional: Negative for activity change, appetite change, chills, fatigue, fever and unexpected weight change.  HENT: Negative for hearing loss.   Eyes: Negative for visual disturbance.  Respiratory: Negative for cough, chest tightness, shortness of breath and wheezing.   Cardiovascular: Negative for chest pain, palpitations and leg swelling.  Gastrointestinal: Negative for abdominal distention, abdominal pain, blood in stool, constipation, diarrhea, nausea and vomiting.  Genitourinary: Negative for difficulty urinating and hematuria.  Musculoskeletal: Negative for arthralgias, myalgias and neck pain.  Skin: Negative for rash.  Neurological: Negative for dizziness, seizures, syncope and headaches.  Hematological: Negative for adenopathy. Does not bruise/bleed easily.  Psychiatric/Behavioral: Negative for dysphoric mood. The patient is not nervous/anxious.    Per HPI unless specifically indicated in ROS section  Objective:    BP (!) 158/88 (BP Location: Right Arm, Cuff Size: Large)   Pulse 69   Temp 98.7 F (37.1 C) (Oral)   Ht _0  (1.753 m)   Wt 189 lb 8 oz (86 kg)   SpO2 97%   BMI 27.98 kg/m   Wt Readings from Last 3 Encounters:  12/10/16 189 lb 8 oz (86 kg)  11/28/15 192 lb 12 oz (87.4 kg)  11/15/14 193 lb 4 oz (87.7 kg)    Physical Exam  Constitutional: He is oriented to person, place, and time. He appears well-developed and well-nourished. No distress.  HENT:  Head: Normocephalic and  atraumatic.  Right Ear: Hearing, tympanic membrane, external ear and ear canal normal.  Left Ear: Hearing, tympanic membrane, external ear and ear canal normal.  Nose: Nose normal.  Mouth/Throat: Uvula is midline, oropharynx is clear and moist and mucous membranes are normal. No oropharyngeal exudate, posterior oropharyngeal edema or posterior oropharyngeal erythema.  Eyes: Conjunctivae and EOM are normal. Pupils are equal, round, and reactive to light. No scleral icterus.  Neck: Normal range of motion. Neck supple. No thyromegaly present.  Cardiovascular: Normal rate, regular rhythm and intact distal pulses.   Murmur (3/6 SEM best at apex) heard. Pulses:      Radial pulses are 2+ on the right side, and 2+ on the left side.  Pulmonary/Chest: Effort normal and breath sounds normal. No respiratory distress. He has no wheezes. He has no rales.  Abdominal: Soft. Bowel sounds are normal. He exhibits no distension and no mass. There is no tenderness. There is no rebound and no guarding.  Musculoskeletal: Normal range of motion. He exhibits no edema.  Lymphadenopathy:    He has no cervical adenopathy.  Neurological: He is alert and oriented to person, place, and time.  CN grossly intact, station and gait intact  Skin: Skin is warm and dry. No rash noted.  Psychiatric: He has a normal mood and affect. His behavior is normal. Judgment and thought content normal.  Nursing note and vitals reviewed.  Results for orders placed or performed in visit on 11/09/15  Comprehensive metabolic panel  Result Value Ref Range   Sodium 137 135 - 146 mmol/L   Potassium 3.9 3.5 - 5.3 mmol/L   Chloride 106 98 - 110 mmol/L   CO2 20 20 - 31 mmol/L   Glucose, Bld 117 (H) 65 - 99 mg/dL   BUN 11 7 - 25 mg/dL   Creat 0.79 0.70 - 1.25 mg/dL   Total Bilirubin 0.7 0.2 - 1.2 mg/dL   Alkaline Phosphatase 57 40 - 115 U/L   AST 23 10 - 35 U/L   ALT 30 9 - 46 U/L   Total Protein 6.8 6.1 - 8.1 g/dL   Albumin 4.2 3.6 - 5.1  g/dL   Calcium 9.3 8.6 - 10.3 mg/dL  Lipid panel  Result Value Ref Range   Cholesterol 147 125 - 200 mg/dL   Triglycerides 95 <150 mg/dL   HDL 38 (L) >=40 mg/dL   Total CHOL/HDL Ratio 3.9 <=5.0 Ratio   VLDL 19 <30 mg/dL   LDL Cholesterol 90 <130 mg/dL  PSA, serial  Result Value Ref Range   PSA 4.64 (H) <=4.00 ng/mL  PSA, free  Result Value Ref Range   PSA, Free 0.74 ng/mL   PSA, Free Pct 16 (L) >25 %      Assessment & Plan:   Problem List Items Addressed This Visit    Healthcare maintenance -  Primary    Preventative protocols reviewed and updated unless pt declined. Discussed healthy diet and lifestyle.  Labs done at [facility] - not available at this time. Will request.       HLA-B27 spondyloarthropathy (Chronic)    Followed by Dr Estanislado Pandy, on sulfasalazine.       HLD (hyperlipidemia)    Chronic, stable. Continue current regimen.       HTN (hypertension)    Chronic, elevated. Reports compliance with amlodipine.  Advised to start monitoring BP at home and update me in 2 wks with readings.       Prostate cancer [facility])    Followed by Dr Dylan Carter - appreciate urology care.       Systolic murmur    Longstanding, sounds stable       Other Visit Diagnoses    Special screening for malignant neoplasms, colon       Relevant Orders   Ambulatory referral to Gastroenterology       Follow up plan: Return in about 1 year (around 12/10/2017) for annual exam, prior fasting for blood work.  Ria Bush, MD

## 2016-12-10 NOTE — Assessment & Plan Note (Signed)
Followed by Dr Estanislado Pandy, on sulfasalazine.

## 2016-12-10 NOTE — Assessment & Plan Note (Signed)
Followed by Dr [name] - appreciate urology care.

## 2016-12-10 NOTE — Assessment & Plan Note (Addendum)
Chronic, elevated. Reports compliance with amlodipine.  Advised to start monitoring BP at home and update me in 2 wks with readings.

## 2016-12-10 NOTE — Assessment & Plan Note (Addendum)
Preventative protocols reviewed and updated unless pt declined. Discussed healthy diet and lifestyle.  Labs done at [facility] - not available at this time. Will request.

## 2016-12-10 NOTE — Patient Instructions (Addendum)
Prevnar today.  We will request labs from [facility] to review.  Shingles shot in a month - either here or at pharmacy. If at pharmacy let us know and we will send prescription. Start monitoring blood pressure at home - as today it was too high. If consistently >140/90, let me know and we will increase amlodipine dose.  Return as needed or in 1 year for next physical.   Health Maintenance, Male A healthy lifestyle and preventative care can promote health and wellness.  Maintain regular health, dental, and eye exams.  Eat a healthy diet. Foods like vegetables, fruits, whole grains, low-fat dairy products, and lean protein foods contain the nutrients you need and are low in calories. Decrease your intake of foods high in solid fats, added sugars, and salt. Get information about a proper diet from your health care provider, if necessary.  Regular physical exercise is one of the most important things you can do for your health. Most adults should get at least 150 minutes of moderate-intensity exercise (any activity that increases your heart rate and causes you to sweat) each week. In addition, most adults need muscle-strengthening exercises on 2 or more days a week.   Maintain a healthy weight. The body mass index (BMI) is a screening tool to identify possible weight problems. It provides an estimate of body fat based on height and weight. Your health care provider can find your BMI and can help you achieve or maintain a healthy weight. For males 20 years and older:  A BMI below 18.5 is considered underweight.  A BMI of 18.5 to 24.9 is normal.  A BMI of 25 to 29.9 is considered overweight.  A BMI of 30 and above is considered obese.  Maintain normal blood lipids and cholesterol by exercising and minimizing your intake of saturated fat. Eat a balanced diet with plenty of fruits and vegetables. Blood tests for lipids and cholesterol should begin at age 62 and be repeated every 5 years. If your lipid  or cholesterol levels are high, you are over age 74, or you are at high risk for heart disease, you may need your cholesterol levels checked more frequently.Ongoing high lipid and cholesterol levels should be treated with medicines if diet and exercise are not working.  If you smoke, find out from your health care provider how to quit. If you do not use tobacco, do not start.  Lung cancer screening is recommended for adults aged 94-80 years who are at high risk for developing lung cancer because of a history of smoking. A yearly low-dose CT scan of the lungs is recommended for people who have at least a 30-pack-year history of smoking and are current smokers or have quit within the past 15 years. A pack year of smoking is smoking an average of 1 pack of cigarettes a day for 1 year (for example, a 30-pack-year history of smoking could mean smoking 1 pack a day for 30 years or 2 packs a day for 15 years). Yearly screening should continue until the smoker has stopped smoking for at least 15 years. Yearly screening should be stopped for people who develop a health problem that would prevent them from having lung cancer treatment.  If you choose to drink alcohol, do not have more than 2 drinks per day. One drink is considered to be 12 oz (360 mL) of beer, 5 oz (150 mL) of wine, or 1.5 oz (45 mL) of liquor.  Avoid the use of street drugs. Do  not share needles with anyone. Ask for help if you need support or instructions about stopping the use of drugs.  High blood pressure causes heart disease and increases the risk of stroke. High blood pressure is more likely to develop in:  People who have blood pressure in the end of the normal range (100-139/85-89 mm Hg).  People who are overweight or obese.  People who are African American.  If you are 21-17 years of age, have your blood pressure checked every 3-5 years. If you are 48 years of age or older, have your blood pressure checked every year. You should  have your blood pressure measured twice-once when you are at a hospital or clinic, and once when you are not at a hospital or clinic. Record the average of the two measurements. To check your blood pressure when you are not at a hospital or clinic, you can use:  An automated blood pressure machine at a pharmacy.  A home blood pressure monitor.  If you are 81-78 years old, ask your health care provider if you should take aspirin to prevent heart disease.  Diabetes screening involves taking a blood sample to check your fasting blood sugar level. This should be done once every 3 years after age 63 if you are at a normal weight and without risk factors for diabetes. Testing should be considered at a younger age or be carried out more frequently if you are overweight and have at least 1 risk factor for diabetes.  Colorectal cancer can be detected and often prevented. Most routine colorectal cancer screening begins at the age of 102 and continues through age 38. However, your health care provider may recommend screening at an earlier age if you have risk factors for colon cancer. On a yearly basis, your health care provider may provide home test kits to check for hidden blood in the stool. A small camera at the end of a tube may be used to directly examine the colon (sigmoidoscopy or colonoscopy) to detect the earliest forms of colorectal cancer. Talk to your health care provider about this at age 9 when routine screening begins. A direct exam of the colon should be repeated every 5-10 years through age 50, unless early forms of precancerous polyps or small growths are found.  People who are at an increased risk for hepatitis B should be screened for this virus. You are considered at high risk for hepatitis B if:  You were born in a country where hepatitis B occurs often. Talk with your health care provider about which countries are considered high risk.  Your parents were born in a high-risk country and  you have not received a shot to protect against hepatitis B (hepatitis B vaccine).  You have HIV or AIDS.  You use needles to inject street drugs.  You live with, or have sex with, someone who has hepatitis B.  You are a man who has sex with other men (MSM).  You get hemodialysis treatment.  You take certain medicines for conditions like cancer, organ transplantation, and autoimmune conditions.  Hepatitis C blood testing is recommended for all people born from [birth year] through 1964 and any individual with known risk factors for hepatitis C.  Healthy men should no longer receive prostate-specific antigen (PSA) blood tests as part of routine cancer screening. Talk to your health care provider about prostate cancer screening.  Testicular cancer screening is not recommended for adolescents or adult males who have no symptoms. Screening includes self-exam, a  health care provider exam, and other screening tests. Consult with your health care provider about any symptoms you have or any concerns you have about testicular cancer.  Practice safe sex. Use condoms and avoid high-risk sexual practices to reduce the spread of sexually transmitted infections (STIs).  You should be screened for STIs, including gonorrhea and chlamydia if:  You are sexually active and are younger than 24 years.  You are older than 24 years, and your health care provider tells you that you are at risk for this type of infection.  Your sexual activity has changed since you were last screened, and you are at an increased risk for chlamydia or gonorrhea. Ask your health care provider if you are at risk.  If you are at risk of being infected with HIV, it is recommended that you take a prescription medicine daily to prevent HIV infection. This is called pre-exposure prophylaxis (PrEP). You are considered at risk if:  You are a man who has sex with other men (MSM).  You are a heterosexual man who is sexually active with  multiple partners.  You take drugs by injection.  You are sexually active with a partner who has HIV.  Talk with your health care provider about whether you are at high risk of being infected with HIV. If you choose to begin PrEP, you should first be tested for HIV. You should then be tested every 3 months for as long as you are taking PrEP.  Use sunscreen. Apply sunscreen liberally and repeatedly throughout the day. You should seek shade when your shadow is shorter than you. Protect yourself by wearing long sleeves, pants, a wide-brimmed hat, and sunglasses year round whenever you are outdoors.  Tell your health care provider of new moles or changes in moles, especially if there is a change in shape or color. Also, tell your health care provider if a mole is larger than the size of a pencil eraser.  A one-time screening for abdominal aortic aneurysm (AAA) and surgical repair of large AAAs by ultrasound is recommended for men aged 27-75 years who are current or former smokers.  Stay current with your vaccines (immunizations). This information is not intended to replace advice given to you by your health care provider. Make sure you discuss any questions you have with your health care provider. Document Released: 04/24/2008 Document Revised: 11/17/2014 Document Reviewed: 07/31/2015 Elsevier Interactive Patient Education  2017 [employer].

## 2016-12-10 NOTE — Progress Notes (Signed)
Pre visit review using our clinic review tool, if applicable. No additional management support is needed unless otherwise documented below in the visit note. 

## 2016-12-10 NOTE — Addendum Note (Signed)
Addended by: Tammi Sou on: 12/10/2016 08:45 AM   Modules accepted: Orders

## 2016-12-11 NOTE — Telephone Encounter (Signed)
Yes received and in Dylan Carter's box. plz notify patient labs ok - kidneys, liver, chol levels, blood counts and prostate.  Sugar was elevated for fasting reading - avoid added sugars and sweetened beverages in diet.   plz fax copy attn Dr [name] at [facility] urology.

## 2016-12-12 NOTE — Telephone Encounter (Signed)
Left message on patient's home vm and cell vm that we were returning his call with information and for him to call us back at his earliest convenience.

## 2016-12-16 ENCOUNTER — Other Ambulatory Visit: Payer: Self-pay

## 2016-12-16 NOTE — Telephone Encounter (Signed)
Spoke with patient and results reviewed.  Will abstract and scan in to [facility] as well as fax copy to Dr. [name] per MD order.

## 2017-01-10 ENCOUNTER — Other Ambulatory Visit: Payer: Self-pay | Admitting: Family Medicine

## 2017-01-12 ENCOUNTER — Encounter: Payer: Self-pay | Admitting: Family Medicine

## 2017-03-13 ENCOUNTER — Encounter: Payer: Self-pay | Admitting: Family Medicine

## 2017-08-24 ENCOUNTER — Telehealth: Payer: Self-pay | Admitting: Rheumatology

## 2017-08-24 NOTE — Telephone Encounter (Signed)
Patient scheduled a rov for March with doctor, but would like to know if he could get an rx called in for aches/ pains. Patient does not feel like he has a lot of inflammation right now. Patient uses [facility] on [address].

## 2017-08-26 ENCOUNTER — Emergency Department (HOSPITAL_COMMUNITY): Payer: PRIVATE HEALTH INSURANCE

## 2017-08-26 ENCOUNTER — Encounter (HOSPITAL_COMMUNITY): Payer: Self-pay | Admitting: Emergency Medicine

## 2017-08-26 ENCOUNTER — Inpatient Hospital Stay (HOSPITAL_COMMUNITY)
Admission: EM | Admit: 2017-08-26 | Discharge: 2017-09-10 | DRG: 064 | Disposition: E | Payer: PRIVATE HEALTH INSURANCE | Attending: Neurology | Admitting: Neurology

## 2017-08-26 DIAGNOSIS — G8250 Quadriplegia, unspecified: Secondary | ICD-10-CM | POA: Diagnosis present

## 2017-08-26 DIAGNOSIS — Z8249 Family history of ischemic heart disease and other diseases of the circulatory system: Secondary | ICD-10-CM

## 2017-08-26 DIAGNOSIS — Z823 Family history of stroke: Secondary | ICD-10-CM

## 2017-08-26 DIAGNOSIS — R402212 Coma scale, best verbal response, none, at arrival to emergency department: Secondary | ICD-10-CM | POA: Diagnosis present

## 2017-08-26 DIAGNOSIS — R402312 Coma scale, best motor response, none, at arrival to emergency department: Secondary | ICD-10-CM | POA: Diagnosis present

## 2017-08-26 DIAGNOSIS — R402112 Coma scale, eyes open, never, at arrival to emergency department: Secondary | ICD-10-CM | POA: Diagnosis present

## 2017-08-26 DIAGNOSIS — Z7982 Long term (current) use of aspirin: Secondary | ICD-10-CM

## 2017-08-26 DIAGNOSIS — I613 Nontraumatic intracerebral hemorrhage in brain stem: Secondary | ICD-10-CM | POA: Diagnosis present

## 2017-08-26 DIAGNOSIS — I615 Nontraumatic intracerebral hemorrhage, intraventricular: Secondary | ICD-10-CM | POA: Diagnosis present

## 2017-08-26 DIAGNOSIS — G936 Cerebral edema: Secondary | ICD-10-CM | POA: Diagnosis present

## 2017-08-26 DIAGNOSIS — J9601 Acute respiratory failure with hypoxia: Secondary | ICD-10-CM | POA: Diagnosis present

## 2017-08-26 DIAGNOSIS — I10 Essential (primary) hypertension: Secondary | ICD-10-CM | POA: Diagnosis present

## 2017-08-26 DIAGNOSIS — E785 Hyperlipidemia, unspecified: Secondary | ICD-10-CM | POA: Diagnosis present

## 2017-08-26 DIAGNOSIS — C61 Malignant neoplasm of prostate: Secondary | ICD-10-CM | POA: Diagnosis present

## 2017-08-26 DIAGNOSIS — M479 Spondylosis, unspecified: Secondary | ICD-10-CM | POA: Diagnosis present

## 2017-08-26 DIAGNOSIS — Z515 Encounter for palliative care: Secondary | ICD-10-CM | POA: Diagnosis not present

## 2017-08-26 DIAGNOSIS — G911 Obstructive hydrocephalus: Secondary | ICD-10-CM | POA: Diagnosis present

## 2017-08-26 DIAGNOSIS — R471 Dysarthria and anarthria: Secondary | ICD-10-CM | POA: Diagnosis present

## 2017-08-26 DIAGNOSIS — G935 Compression of brain: Secondary | ICD-10-CM | POA: Diagnosis present

## 2017-08-26 DIAGNOSIS — Z87891 Personal history of nicotine dependence: Secondary | ICD-10-CM

## 2017-08-26 DIAGNOSIS — Z79899 Other long term (current) drug therapy: Secondary | ICD-10-CM | POA: Diagnosis not present

## 2017-08-26 DIAGNOSIS — I619 Nontraumatic intracerebral hemorrhage, unspecified: Secondary | ICD-10-CM | POA: Diagnosis present

## 2017-08-26 DIAGNOSIS — Z66 Do not resuscitate: Secondary | ICD-10-CM | POA: Diagnosis not present

## 2017-08-26 DIAGNOSIS — J301 Allergic rhinitis due to pollen: Secondary | ICD-10-CM | POA: Diagnosis present

## 2017-08-26 LAB — I-STAT CHEM 8, ED
BUN: 11 mg/dL (ref 6–20)
Calcium, Ion: 1.1 mmol/L — ABNORMAL LOW (ref 1.15–1.40)
Chloride: 104 mmol/L (ref 101–111)
Creatinine, Ser: 0.7 mg/dL (ref 0.61–1.24)
Glucose, Bld: 121 mg/dL — ABNORMAL HIGH (ref 65–99)
HEMATOCRIT: 44 % (ref 39.0–52.0)
HEMOGLOBIN: 15 g/dL (ref 13.0–17.0)
POTASSIUM: 5.2 mmol/L — AB (ref 3.5–5.1)
Sodium: 140 mmol/L (ref 135–145)
TCO2: 27 mmol/L (ref 22–32)

## 2017-08-26 LAB — COMPREHENSIVE METABOLIC PANEL
ALT: 21 U/L (ref 17–63)
ANION GAP: 11 (ref 5–15)
AST: 24 U/L (ref 15–41)
Albumin: 4.1 g/dL (ref 3.5–5.0)
Alkaline Phosphatase: 76 U/L (ref 38–126)
BILIRUBIN TOTAL: 0.6 mg/dL (ref 0.3–1.2)
BUN: 8 mg/dL (ref 6–20)
CHLORIDE: 102 mmol/L (ref 101–111)
CO2: 22 mmol/L (ref 22–32)
Calcium: 9 mg/dL (ref 8.9–10.3)
Creatinine, Ser: 0.84 mg/dL (ref 0.61–1.24)
GFR calc Af Amer: >60 mL/min (ref >60)
Glucose, Bld: 125 mg/dL — ABNORMAL HIGH (ref 65–99)
POTASSIUM: 3.4 mmol/L — AB (ref 3.5–5.1)
Sodium: 135 mmol/L (ref 135–145)
TOTAL PROTEIN: 7 g/dL (ref 6.5–8.1)

## 2017-08-26 LAB — APTT: APTT: 51 s — AB (ref 24–36)

## 2017-08-26 LAB — DIFFERENTIAL
BASOS ABS: 0.1 10*3/uL (ref 0.0–0.1)
BASOS PCT: 1 %
EOS ABS: 0.5 10*3/uL (ref 0.0–0.7)
EOS PCT: 5 %
Lymphocytes Relative: 31 %
Lymphs Abs: 3.3 10*3/uL (ref 0.7–4.0)
MONOS PCT: 6 %
Monocytes Absolute: 0.6 10*3/uL (ref 0.1–1.0)
NEUTROS PCT: 57 %
Neutro Abs: 6 10*3/uL (ref 1.7–7.7)

## 2017-08-26 LAB — CBG MONITORING, ED: GLUCOSE-CAPILLARY: 104 mg/dL — AB (ref 65–99)

## 2017-08-26 LAB — CBC
HEMATOCRIT: 42.5 % (ref 39.0–52.0)
Hemoglobin: 14.9 g/dL (ref 13.0–17.0)
MCH: 30.4 pg (ref 26.0–34.0)
MCHC: 35.1 g/dL (ref 30.0–36.0)
MCV: 86.7 fL (ref 78.0–100.0)
Platelets: 272 10*3/uL (ref 150–400)
RBC: 4.9 MIL/uL (ref 4.22–5.81)
RDW: 12.4 % (ref 11.5–15.5)
WBC: 10.4 10*3/uL (ref 4.0–10.5)

## 2017-08-26 LAB — I-STAT ARTERIAL BLOOD GAS, ED
ACID-BASE EXCESS: 1 mmol/L (ref 0.0–2.0)
BICARBONATE: 26.6 mmol/L (ref 20.0–28.0)
O2 Saturation: 100 %
PH ART: 7.407 (ref 7.350–7.450)
TCO2: 28 mmol/L (ref 22–32)
pCO2 arterial: 42 mmHg (ref 32.0–48.0)
pO2, Arterial: 305 mmHg — ABNORMAL HIGH (ref 83.0–108.0)

## 2017-08-26 LAB — PROTIME-INR
INR: 1.03
Prothrombin Time: 13.4 seconds (ref 11.4–15.2)

## 2017-08-26 LAB — I-STAT TROPONIN, ED: TROPONIN I, POC: 0 ng/mL (ref 0.00–0.08)

## 2017-08-26 LAB — ETHANOL

## 2017-08-26 MED ORDER — PANTOPRAZOLE SODIUM 40 MG IV SOLR: 40.0000 mg | Freq: Every day | INTRAVENOUS | Status: DC

## 2017-08-26 MED ORDER — SENNOSIDES-DOCUSATE SODIUM 8.6-50 MG PO TABS: 1.0000 | ORAL_TABLET | Freq: Two times a day (BID) | ORAL | Status: DC

## 2017-08-26 MED ORDER — MORPHINE BOLUS VIA INFUSION
5.0000 mg | INTRAVENOUS | Status: DC | PRN
  Filled 2017-08-26: qty 20

## 2017-08-26 MED ORDER — SODIUM CHLORIDE 0.9 % IV SOLN
10.0000 mg/h | INTRAVENOUS | Status: DC
  Administered 2017-08-26 – 2017-08-27 (×2): 10 mg/h via INTRAVENOUS
  Filled 2017-08-26 (×2): qty 10

## 2017-08-26 MED ORDER — STROKE: EARLY STAGES OF RECOVERY BOOK
Freq: Once | Status: DC
  Filled 2017-08-26: qty 1

## 2017-08-26 MED ORDER — CLEVIDIPINE BUTYRATE 0.5 MG/ML IV EMUL
0.0000 mg/h | INTRAVENOUS | Status: DC
  Administered 2017-08-26: 1 mg/h via INTRAVENOUS
  Filled 2017-08-26: qty 50

## 2017-08-26 MED ORDER — ACETAMINOPHEN 325 MG PO TABS: 650.0000 mg | ORAL_TABLET | ORAL | Status: DC | PRN

## 2017-08-26 MED ORDER — ACETAMINOPHEN 650 MG RE SUPP
650.0000 mg | RECTAL | Status: DC | PRN
Start: 2017-08-26 — End: 2017-08-27

## 2017-08-26 MED ORDER — ACETAMINOPHEN 160 MG/5ML PO SOLN: 650.0000 mg | ORAL | Status: DC | PRN

## 2017-08-26 MED ORDER — CLEVIDIPINE BUTYRATE 0.5 MG/ML IV EMUL
0.0000 mg/h | INTRAVENOUS | Status: DC
Start: 2017-08-26 — End: 2017-08-27
  Administered 2017-08-26: 10 mg/h via INTRAVENOUS
  Filled 2017-08-26 (×2): qty 50

## 2017-08-26 MED ORDER — LABETALOL HCL 5 MG/ML IV SOLN
10.0000 mg | Freq: Once | INTRAVENOUS | Status: AC
  Administered 2017-08-26: 10 mg via INTRAVENOUS
  Filled 2017-08-26: qty 4

## 2017-08-26 MED ORDER — PROPOFOL 1000 MG/100ML IV EMUL
INTRAVENOUS | Status: AC
  Administered 2017-08-26: 10 ug/kg/min
  Filled 2017-08-26: qty 100

## 2017-08-26 MED ORDER — LABETALOL HCL 5 MG/ML IV SOLN: 20.0000 mg | Freq: Once | INTRAVENOUS | Status: DC

## 2017-08-26 NOTE — ED Notes (Signed)
Return from CT

## 2017-08-26 NOTE — Consult Note (Signed)
Reason for Consult:intracerebral hemorrhage and intraventricular hemorrhage Referring Physician: neurology  Dylan Carter is an 66 y.o. male.  HPI: Dylan Carter is a very pleasant 66 year old gentleman who was cutting the grass earlier came in complaining of a severe headache that started expressing numbness and tingling in his fingers and toes and then became dysarthric and unresponsive. Patient was intubated and brought to the ER worked up and workup revealed a large midbrain hemorrhage with interventricular extension.  Past Medical History:  Diagnosis Date  . Allergic rhinitis due to pollen   . Arthritis   . Chicken pox   . High cholesterol   . 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 HTN (hypertension)   . Prostate cancer [facility]) 08/2013   Dec 2014- 12 core, atypia at L apex, Jun 2015- 14 core biopsy, atypia at L apex; Jan 2017 - new nodule pending MRI and rpt biopsy Dylan Carter)    Past Surgical History:  Procedure Laterality Date  . TONSILLECTOMY  1958  . vascetomy  1985    Family History  Problem Relation Age of Onset  . Stroke Father   . Diabetes Father   . Coronary artery disease Mother 23       MI, CHF  . Cancer Other        aunts and uncles    Social History:  reports that he has quit smoking. His smoking use included Cigarettes. He quit after 20.00 years of use. He quit smokeless tobacco use about 16 years ago. He reports that he does not drink alcohol or use drugs.  Allergies: No Known Allergies  Medications: I have reviewed the patient's current medications.  Results for orders placed or performed during the hospital encounter of 08/22/2017 (from the past 48 hour(s))  CBG monitoring, ED     Status: Abnormal   Collection Time: 08/20/2017  7:46 PM  Result Value Ref Range   Glucose-Capillary 104 (H) 65 - 99 mg/dL   Comment 1 Notify RN    Comment 2 Document in Chart   Ethanol     Status: None   Collection Time: 08/29/2017  7:51 PM  Result Value Ref Range    Alcohol, Ethyl (B) <10 <10 mg/dL    Comment:        LOWEST DETECTABLE LIMIT FOR SERUM ALCOHOL IS 10 mg/dL FOR MEDICAL PURPOSES ONLY   Protime-INR     Status: None   Collection Time: 08/19/2017  7:52 PM  Result Value Ref Range   Prothrombin Time 13.4 11.4 - 15.2 seconds   INR 1.03   APTT     Status: Abnormal   Collection Time: 08/10/2017  7:52 PM  Result Value Ref Range   aPTT 51 (H) 24 - 36 seconds    Comment:        IF BASELINE aPTT IS ELEVATED, SUGGEST PATIENT RISK ASSESSMENT BE USED TO DETERMINE APPROPRIATE ANTICOAGULANT THERAPY.   CBC     Status: None   Collection Time: 09/05/2017  7:52 PM  Result Value Ref Range   WBC 10.4 4.0 - 10.5 K/uL   RBC 4.90 4.22 - 5.81 MIL/uL   Hemoglobin 14.9 13.0 - 17.0 g/dL   HCT 42.5 39.0 - 52.0 %   MCV 86.7 78.0 - 100.0 fL   MCH 30.4 26.0 - 34.0 pg   MCHC 35.1 30.0 - 36.0 g/dL   RDW 12.4 11.5 - 15.5 %   Platelets 272 150 - 400 K/uL  Differential     Status: None  Collection Time: 08/22/2017  7:52 PM  Result Value Ref Range   Neutrophils Relative % 57 %   Neutro Abs 6.0 1.7 - 7.7 K/uL   Lymphocytes Relative 31 %   Lymphs Abs 3.3 0.7 - 4.0 K/uL   Monocytes Relative 6 %   Monocytes Absolute 0.6 0.1 - 1.0 K/uL   Eosinophils Relative 5 %   Eosinophils Absolute 0.5 0.0 - 0.7 K/uL   Basophils Relative 1 %   Basophils Absolute 0.1 0.0 - 0.1 K/uL  Comprehensive metabolic panel     Status: Abnormal   Collection Time: 08/19/2017  7:52 PM  Result Value Ref Range   Sodium 135 135 - 145 mmol/L   Potassium 3.4 (L) 3.5 - 5.1 mmol/L   Chloride 102 101 - 111 mmol/L   CO2 22 22 - 32 mmol/L   Glucose, Bld 125 (H) 65 - 99 mg/dL   BUN 8 6 - 20 mg/dL   Creatinine, Ser 0.84 0.61 - 1.24 mg/dL   Calcium 9.0 8.9 - 10.3 mg/dL   Total Protein 7.0 6.5 - 8.1 g/dL   Albumin 4.1 3.5 - 5.0 g/dL   AST 24 15 - 41 U/L   ALT 21 17 - 63 U/L   Alkaline Phosphatase 76 38 - 126 U/L   Total Bilirubin 0.6 0.3 - 1.2 mg/dL   GFR calc non Af Amer >60 >60 mL/min   GFR calc  Af Amer >60 >60 mL/min    Comment: (NOTE) The eGFR has been calculated using the CKD EPI equation. This calculation has not been validated in all clinical situations. eGFR's persistently <60 mL/min signify possible Chronic Kidney Disease.    Anion gap 11 5 - 15  I-Stat Chem 8, ED     Status: Abnormal   Collection Time: 08/12/2017  7:57 PM  Result Value Ref Range   Sodium 140 135 - 145 mmol/L   Potassium 5.2 (H) 3.5 - 5.1 mmol/L   Chloride 104 101 - 111 mmol/L   BUN 11 6 - 20 mg/dL   Creatinine, Ser 0.70 0.61 - 1.24 mg/dL   Glucose, Bld 121 (H) 65 - 99 mg/dL   Calcium, Ion 1.10 (L) 1.15 - 1.40 mmol/L   TCO2 27 22 - 32 mmol/L   Hemoglobin 15.0 13.0 - 17.0 g/dL   HCT 44.0 39.0 - 52.0 %  I-stat troponin, ED     Status: None   Collection Time: 08/25/2017  8:07 PM  Result Value Ref Range   Troponin i, poc 0.00 0.00 - 0.08 ng/mL   Comment 3            Comment: Due to the release kinetics of cTnI, a negative result within the first hours of the onset of symptoms does not rule out myocardial infarction with certainty. If myocardial infarction is still suspected, repeat the test at appropriate intervals.     Dg Chest Portable 1 View  Result Date: 08/31/2017 CLINICAL DATA:  Respiratory failure.  History of prostate cancer. EXAM: PORTABLE CHEST 1 VIEW COMPARISON:  None. FINDINGS: Endotracheal tube tip at the carina. Nasogastric tube past GE junction, distal tip out of field-of-view. Cardiomediastinal silhouette is normal. Calcified aortic knob. Mild bronchitic changes without pleural effusion or focal consolidation. No pneumothorax. Soft tissue planes and included osseous structures are nonsuspicious. IMPRESSION: Mild bronchitic changes. Endotracheal tube tip at the carina, recommend 1-2 cm of retraction. Nasogastric tube tip past GE junction. Aortic Atherosclerosis (ICD10-I70.0). Electronically Signed   By: Thana Farr.D.  On: 09/03/2017 20:10   Ct Head Code Stroke Wo  Contrast  Result Date: 08/25/2017 CLINICAL DATA:  Code stroke. Worst headache of life after mowing lawn. Unresponsive, arm weakness. History of prostate cancer. EXAM: CT HEAD WITHOUT CONTRAST TECHNIQUE: Contiguous axial images were obtained from the base of the skull through the vertex without intravenous contrast. COMPARISON:  None. FINDINGS: BRAIN: Acute symmetric midbrain/tectal hemorrhage with intraventricular extension. Moderate acute obstructive hydrocephalus with interstitial/transependymal edema. No acute large vascular territory infarct. No midline shift. No abnormal extra-axial fluid collections. Basal cisterns are patent. VASCULAR: Mild calcific atherosclerosis carotid siphon. SKULL/SOFT TISSUES: No skull fracture. No significant soft tissue swelling. ORBITS/SINUSES: The included ocular globes and orbital contents are normal.The mastoid aircells and included paranasal sinuses are well-aerated. OTHER: None. IMPRESSION: 1. Acute midbrain hemorrhage with intraventricular extension. 2. Moderate acute obstructive hydrocephalus. 3. Dr. Cheral Marker paged via [place] hospital paging system on 08/11/2017 at 8:34 pm, awaiting return call. 4. Critical Value/emergent results were called by telephone at the time of interpretation on 08/12/2017 at 8:38 pm to Dr. Pilar Plate MU , who verbally acknowledged these results. Electronically Signed   By: Elon Alas M.D.   On: 08/16/2017 20:38    Review of Systems  Unable to perform ROS: Critical illness   Blood pressure (!) 203/119, pulse 70, temperature (!) 97.1 F (36.2 C), temperature source Axillary, resp. rate 12, SpO2 100 %. Physical Exam  Neurological: He is unresponsive. GCS eye subscore is 1. GCS verbal subscore is 1. GCS motor subscore is 1.  Patient comatose pupils are pinpoint and nonreactive no corneals no gag minimal flexion withdrawal to noxious stimulation    Assessment/Plan: 66 year old with a midbrain hemorrhage with extension into  ventricular. This hemorrhageis not compatible with functional survival. His clinical exam is consistent with near brain death with pinpoint pupils nonreactive no corneals no gag and very weak movements to noxious stimulus in. I do not think that extra ventricular drainage is indicated in this situation and extensive conversations with the patient's wife and explained that believe this is a nonsurvivable hemorrhage and that I would not recommend doing anything interventional. She isgoing to discuss this with her family and let us know.  Estela Vinal P 08/17/2017, 9:14 PM

## 2017-08-26 NOTE — Progress Notes (Signed)
[facility] contacted @[phone] . Referral [phone]. Instructed to call back with cardiac TOD for potential eye donor.   Heyward Douthit, [place], RN, 11:35 PM

## 2017-08-26 NOTE — Progress Notes (Signed)
[facility] Progress Note Patient Name: Dylan Carter DOB: 1951/05/15 MRN: [id number]   Date of Service  09/04/2017  HPI/Events of Note  Pt seen and evaluated by CCM team. Planned for one way extubation  eICU Interventions   Nothing further to add.         Laverle Hobby 09/03/2017, 11:25 PM

## 2017-08-26 NOTE — Telephone Encounter (Signed)
Left message for patient to call the office

## 2017-08-26 NOTE — ED Notes (Signed)
Wife at bedside.

## 2017-08-26 NOTE — ED Triage Notes (Signed)
Pt to ED via [facility].  Pt's wife reports pt had been mowing the grass, came into house and stated he had the worse headache he had ever had.  Pt then began to c/o left arm and face tingling.  Pt's wife called 45 and and fire dept. Arrival pt had left side weakness. On EMS arrival pt was unresponsive

## 2017-08-26 NOTE — ED Notes (Signed)
Pt to CT

## 2017-08-26 NOTE — Progress Notes (Signed)
This note also relates to the following rows which could not be included: SpO2 - Cannot attach notes to unvalidated device data  Patient intubated at [place] by ED physician. 7.5 ETT, 25 at lip.

## 2017-08-26 NOTE — Consult Note (Signed)
.. ..  Name: Dylan Carter MRN: [id number] DOB: 1950-12-07    ADMISSION DATE:  09/07/2017 CONSULTATION DATE:  08/31/2017  REFERRING MD :  Cheral Marker CHIEF COMPLAINT:  SEVERE HEADACHE  BRIEF PATIENT DESCRIPTION:  66 yr old male who presented on 10/17 with complaints of severe headache , numbness and tingling in his fingers and toes became dysarthric and unresponsive was tubed in ER. CODE STROKE- CT Head shows significant large midbrain hemorrhage with interventricular extension  SIGNIFICANT EVENTS  LARGE MIDBRAIN HEMORRHAGE WITH INTERVENTRICULAR EXTENSION  STUDIES:  [facility]   HISTORY OF PRESENT ILLNESS:  66 yr old male with PMHx of Prostate Cancer, HTN, and HLD,who presented on 10/17 with complaints of severe headache , numbness and tingling in his fingers and toes. He is accompanied by his wife. Per the chart patient became dysarthric and unresponsive was tubed in ER. CODE STROKE- CT Head shows significant large midbrain hemorrhage with interventricular extension, seen by Neurosurgery not a candidate for EVD. PCCM consulted  PAST MEDICAL HISTORY :   has a past medical history of Allergic rhinitis due to pollen; Arthritis; Chicken pox; High cholesterol; HLA-B27 spondyloarthropathy (03/19/2011); HTN (hypertension); and Prostate cancer ([place]) (08/2013).  has a past surgical history that includes Tonsillectomy (1958) and vascetomy (1985). Prior to Admission medications   Medication Sig Start Date End Date Taking? Authorizing Provider  amLODipine (NORVASC) 5 MG tablet TAKE 1 TABLET BY MOUTH  DAILY Patient taking differently: Take 5 mg by mouth once a day 01/12/17   Ria Bush, MD  aspirin 81 MG tablet Take 81 mg by mouth daily.      [provider]  cholecalciferol (VITAMIN D) 1000 UNITS tablet Take 1,000 Units by mouth daily.    [provider]  fenofibrate (TRICOR) 145 MG tablet TAKE 1 TABLET BY MOUTH AT  BEDTIME Patient taking differently: Take 145 mg by mouth at bedtime  01/12/17   Ria Bush, MD  fish oil-omega-3 fatty acids 1000 MG capsule Take 2 g by mouth daily.      [provider]  Glucosamine-Chondroit-Vit C-Mn (GLUCOSAMINE 1500 COMPLEX PO) Take by mouth 2 (two) times daily.    [provider]  loratadine (CLARITIN) 10 MG tablet Take 10 mg by mouth daily.      [provider]  Lysine 500 MG TABS Take 1 tablet by mouth daily.      [provider]  Multiple Vitamins-Minerals (CENTRUM PO) Take 1 tablet by mouth daily.      [provider]  rosuvastatin (CRESTOR) 40 MG tablet TAKE 1 TABLET BY MOUTH AT  BEDTIME Patient taking differently: Take 40 mg by mouth at bedtime 01/12/17   Ria Bush, MD  sulfaSALAzine (AZULFIDINE) 500 MG tablet Take 500 mg by mouth 2 (two) times daily.      [provider]  vitamin C (ASCORBIC ACID) 500 MG tablet Take 500 mg by mouth daily.      [provider]  vitamin E 400 UNIT capsule Take 400 Units by mouth daily.      [provider]   No Known Allergies  FAMILY HISTORY:  family history includes Cancer in his other; Coronary artery disease (age of onset: 57) in his mother; Diabetes in his father; Stroke in his father. SOCIAL HISTORY:  reports that he has quit smoking. His smoking use included Cigarettes. He quit after 20.00 years of use. He quit smokeless tobacco use about 16 years ago. He reports that he does not drink alcohol or use drugs.  REVIEW OF SYSTEMS:  Unable to obtain due to clinical condition Constitutional: Negative for fever, chills, weight loss, malaise/fatigue and diaphoresis.  HENT: Negative for hearing loss, ear pain, nosebleeds, congestion, sore throat, neck pain, tinnitus and ear discharge.   Eyes: Negative for blurred vision, double vision, photophobia, pain, discharge and redness.  Respiratory: Negative for cough, hemoptysis, sputum production, shortness of breath, wheezing and stridor.   Cardiovascular: Negative for chest  pain, palpitations, orthopnea, claudication, leg swelling and PND.  Gastrointestinal: Negative for heartburn, nausea, vomiting, abdominal pain, diarrhea, constipation, blood in stool and melena.  Genitourinary: Negative for dysuria, urgency, frequency, hematuria and flank pain.  Musculoskeletal: Negative for myalgias, back pain, joint pain and falls.  Skin: Negative for itching and rash.  Neurological: Negative for dizziness, tingling, tremors, sensory change, speech change, focal weakness, seizures, loss of consciousness, weakness and headaches.  Endo/Heme/Allergies: Negative for environmental allergies and polydipsia. Does not bruise/bleed easily.  SUBJECTIVE:   VITAL SIGNS: Temp:  [97.1 F (36.2 C)] 97.1 F (36.2 C) (10/17 1957) Pulse Rate:  [52-90] 90 (10/17 2215) Resp:  [12-21] 20 (10/17 2215) BP: (148-203)/(84-119) 175/98 (10/17 2215) SpO2:  [96 %-100 %] 100 % (10/17 2215) FiO2 (%):  [100 %] 100 % (10/17 2100)  PHYSICAL EXAMINATION: General:  Unresponsive Neuro:  GCS 1 pupils pinpoint and non reactive no gag and decerebrate posturing HEENT:  Normocephalic intubated Cardiovascular:  S1 and S2 appreciated Lungs:  Clear to auscultation bilaterally Abdomen:  Soft + BS Musculoskeletal:  No obvious deformities. Posturing Skin:  intact   Recent Labs Lab 09/02/2017 1952 08/14/2017 1957  NA 135 140  K 3.4* 5.2*  CL 102 104  CO2 22  --   BUN 8 11  CREATININE 0.84 0.70  GLUCOSE 125* 121*    Recent Labs Lab 09/07/2017 1952 08/22/2017 1957  HGB 14.9 15.0  HCT 42.5 44.0  WBC 10.4  --   PLT 272  --    Dg Chest Portable 1 View  Result Date: 08/24/2017 CLINICAL DATA:  Respiratory failure.  History of prostate cancer. EXAM: PORTABLE CHEST 1 VIEW COMPARISON:  None. FINDINGS: Endotracheal tube tip at the carina. Nasogastric tube past GE junction, distal tip out of field-of-view. Cardiomediastinal silhouette is normal. Calcified aortic knob. Mild bronchitic changes without pleural  effusion or focal consolidation. No pneumothorax. Soft tissue planes and included osseous structures are nonsuspicious. IMPRESSION: Mild bronchitic changes. Endotracheal tube tip at the carina, recommend 1-2 cm of retraction. Nasogastric tube tip past GE junction. Aortic Atherosclerosis (ICD10-I70.0). Electronically Signed   By: Elon Alas M.D.   On: 08/21/2017 20:10   Ct Head Code Stroke Wo Contrast  Addendum Date: 08/21/2017   ADDENDUM REPORT: 08/18/2017 21:31 ADDENDUM: Critical Value/emergent results were called by telephone at the time of interpretation on 09/02/2017 at 9:31 pm to Dr. Cheral Marker, Neurology, who verbally acknowledged these results. Electronically Signed   By: Elon Alas M.D.   On: 08/16/2017 21:31   Result Date: 09/05/2017 CLINICAL DATA:  Code stroke. Worst headache of life after mowing lawn. Unresponsive, arm weakness. History of prostate cancer. EXAM: CT HEAD WITHOUT CONTRAST TECHNIQUE: Contiguous axial images were obtained from the base of the skull through the vertex without intravenous contrast. COMPARISON:  None. FINDINGS: BRAIN: Acute symmetric midbrain/tectal hemorrhage with intraventricular extension. Moderate acute obstructive hydrocephalus with interstitial/transependymal edema. No acute large vascular territory infarct. No midline shift. No abnormal extra-axial fluid collections. Basal cisterns are patent. VASCULAR: Mild calcific atherosclerosis carotid siphon. SKULL/SOFT TISSUES: No skull fracture. No significant  soft tissue swelling. ORBITS/SINUSES: The included ocular globes and orbital contents are normal.The mastoid aircells and included paranasal sinuses are well-aerated. OTHER: None. IMPRESSION: 1. Acute midbrain hemorrhage with intraventricular extension. 2. Moderate acute obstructive hydrocephalus. 3. Dr. Cheral Marker paged via [place] hospital paging system on 09/06/2017 at 8:34 pm, awaiting return call. 4. Critical Value/emergent results were called by  telephone at the time of interpretation on 08/12/2017 at 8:38 pm to Dr. Pilar Plate MU , who verbally acknowledged these results. Electronically Signed: By: Elon Alas M.D. On: 08/27/2017 20:38    ASSESSMENT / PLAN:   I, Dr Seward Carol have personally reviewed patient's available data, including medical history, events of note, physical examination and test results as part of my evaluation. I have discussed with PA and other care providers such as pharmacist, RN and RRT. The patient is critically ill with multiple organ systems failure and requires high complexity decision making for assessment and support, frequent evaluation and titration of therapies, application of advanced monitoring technologies and extensive interpretation of multiple databases. Critical Care Time devoted to patient care services described in this note is 35 Minutes. This time reflects time of care of this signee Dr Seward Carol. This critical care time does not reflect procedure time, or teaching time or supervisory time of PA but could involve care discussion time    66 yr old male who presented on 10/17 with complaints of severe headache , numbness and tingling in his fingers and toes became dysarthric and unresponsive was tubed in ER. CODE STROKE- CT Head shows significant large midbrain hemorrhage with interventricular extension. His prognosis given this finding and his exam is extremely poor, Per Neurosurgery he is not a candidate for extra ventricular drainage and this is a non-survivable hemorrhage. I spoke extensively to wife and 2 daughters regarding these findings. The patient had a living will/adv directive which stated he did not want to be sustained on machines in the event of an irreversible injury. They have decided to make him DNR and to withdraw care at this time. A chaplain has been to the bedside and the family asked questions which I answered fully.   DISPOSITION: withdrawal of care in ED CC TIME:  35 mins  PROGNOSIS: Poor FAMILY: wife daughters and grandchildren at bedside CODE STATUS: DNR   Signed Dr Seward Carol Pulmonary Critical Care Locums Pulmonary and [place] Pager: [phone]  08/24/2017, 10:54 PM

## 2017-08-26 NOTE — Consult Note (Signed)
Admission History and Physical   Referring Physician: Dr. Sherry Ruffing    Chief Complaint: Acute onset of left sided weakness and confusion  HPI: Dylan Carter is an 66 y.o. male who presents with acute tetraplegia and LOC preceded by severe headache. Symptom onset was at 6:45 PM while mowing the grass. The patient came into house and stated he had the worst headache he had ever had. He then began to complain of left arm and face tingling.  His wife called 22 and the fire department. Fire department was first to arrive, noting left hemiplegia. Soon afterwards EMS arrived and at that time the patient was unable to move any extremity and was unarousable.   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   Past Medical History:  Diagnosis Date  . Allergic rhinitis due to pollen   . Arthritis   . Chicken pox   . High cholesterol   . 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 HTN (hypertension)   . Prostate cancer [facility]) 08/2013   Dec 2014- 12 core, atypia at L apex, Jun 2015- 14 core biopsy, atypia at L apex; Jan 2017 - new nodule pending MRI and rpt biopsy [name])    Past Surgical History:  Procedure Laterality Date  . TONSILLECTOMY  1958  . vascetomy  1985    Family History  Problem Relation Age of Onset  . Stroke Father   . Diabetes Father   . Coronary artery disease Mother 13       MI, CHF  . Cancer Other        aunts and uncles   Social History:  reports that he has quit smoking. His smoking use included Cigarettes. He quit after 20.00 years of use. He quit smokeless tobacco use about 16 years ago. He reports that he does not drink alcohol or use drugs.  Allergies: No Known Allergies  Home Medications: Home medication list has been reviewed. Not on a blood thinner.   ROS: Unable to obtain due to AMS.   Physical Examination: Blood pressure (!) 203/119, pulse 70,  temperature (!) 97.1 F (36.2 C), temperature source Axillary, resp. rate 12, SpO2 96 %.  HEENT: [place]/AT Lungs: Demonstrating inability to protect airway Ext: Warm and well perfused  Neurologic Examination:  Performed prior to sedation and intubation Mental Status: Comatose with no response to any stimuli.  Cranial Nerves: II:  No blink to threat. Right pupil 2 mm and sluggish. Left pupil 3 mm and sluggish.  III,IV, VI: Eyes closed; no eyelid elevation to noxious. Downbeating nystagmus. Eyes at midline, does not fixate or deviate eyes towards stimuli. Unable to overcome with doll's eye maneuver.  V,VII: Decreased tone lower quadrants of face bilaterally. Severe dysarthric. Reacts to brow ridge pressure bilaterally.   VIII: No response to voice IX,X: Intubation in process during exam XI: No spontaneous movement of head or shoulders XII:  Intubation in process during exam Motor/Sensory: Does not move arms or legs to noxious stimuli. Increased tone bilateral upper and lower extremities, worse on the right.  Deep Tendon Reflexes: Hyperactive DTRs x 4.  Plantars: Upgoing bilatearally.  Cerebellar/Gait: Unable to assess  Results for orders placed or performed during the hospital encounter of 08/30/2017 (from the past 48 hour(s))  CBG monitoring, ED     Status: Abnormal   Collection Time: 08/21/2017  7:46 PM  Result Value Ref Range   Glucose-Capillary 104 (H)  65 - 99 mg/dL   Comment 1 Notify RN    Comment 2 Document in Chart   CBC     Status: None   Collection Time: 09/03/2017  7:52 PM  Result Value Ref Range   WBC 10.4 4.0 - 10.5 K/uL   RBC 4.90 4.22 - 5.81 MIL/uL   Hemoglobin 14.9 13.0 - 17.0 g/dL   HCT 42.5 39.0 - 52.0 %   MCV 86.7 78.0 - 100.0 fL   MCH 30.4 26.0 - 34.0 pg   MCHC 35.1 30.0 - 36.0 g/dL   RDW 12.4 11.5 - 15.5 %   Platelets 272 150 - 400 K/uL  Differential     Status: None   Collection Time: 09/03/2017  7:52 PM  Result Value Ref Range   Neutrophils Relative % 57 %    Neutro Abs 6.0 1.7 - 7.7 K/uL   Lymphocytes Relative 31 %   Lymphs Abs 3.3 0.7 - 4.0 K/uL   Monocytes Relative 6 %   Monocytes Absolute 0.6 0.1 - 1.0 K/uL   Eosinophils Relative 5 %   Eosinophils Absolute 0.5 0.0 - 0.7 K/uL   Basophils Relative 1 %   Basophils Absolute 0.1 0.0 - 0.1 K/uL  I-Stat Chem 8, ED     Status: Abnormal   Collection Time: 08/24/2017  7:57 PM  Result Value Ref Range   Sodium 140 135 - 145 mmol/L   Potassium 5.2 (H) 3.5 - 5.1 mmol/L   Chloride 104 101 - 111 mmol/L   BUN 11 6 - 20 mg/dL   Creatinine, Ser 0.70 0.61 - 1.24 mg/dL   Glucose, Bld 121 (H) 65 - 99 mg/dL   Calcium, Ion 1.10 (L) 1.15 - 1.40 mmol/L   TCO2 27 22 - 32 mmol/L   Hemoglobin 15.0 13.0 - 17.0 g/dL   HCT 44.0 39.0 - 52.0 %   No results found.  Assessment: 66 y.o. 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 ICH score is 4, which corresponds to a 97% mortality rate.  2. Currently intubated and sedated  Plan: 1. Have discussed his condition with wife and reviewed images with her. Prognosis is poor.  2. Neurosurgery has been consulted for possible external ventricular drain.  3. Admitting to ICU with CCM consulting for vent management 4. BP management with Cleviprex gtt and PRN Labetalol  5. No antiplatelet medications or anticoagulants. DVT prophylaxis with SCDs 6. Palliative care consultation may be needed. Wife is still processing the information that has been related to her and appears to be in emotional distress.  7. Chaplain services at bedside. Appreciate their input.  8. Frequent neuro checks 9. Telemetry monitoring  50 minutes spent in the emergent Neurological evaluation and management of this critically ill patient  _0  signed: Dr. Kerney Elbe  08/25/2017, 8:07 PM

## 2017-08-26 NOTE — ED Provider Notes (Signed)
[facility] EMERGENCY DEPARTMENT Provider Note   CSN: [id number] Arrival date & time: 08/13/2017  1945     History   Chief Complaint Chief Complaint  Patient presents with  . Code Stroke    HPI Dylan Carter is a 66 y.o. male.  The history is provided by the EMS personnel. The history is limited by the condition of the patient.  Neurologic Problem  This is a new problem. The current episode started 1 to 2 hours ago. The problem occurs constantly. The problem has been rapidly worsening. Nothing relieves the symptoms. He has tried nothing for the symptoms. The treatment provided no relief.   LVL 5 Caveat for unresponsiveness   Past Medical History:  Diagnosis Date  . Allergic rhinitis due to pollen   . Arthritis   . Chicken pox   . High cholesterol   . 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 HTN (hypertension)   . Prostate cancer [facility]) 08/2013   Dec 2014- 12 core, atypia at L apex, Jun 2015- 14 core biopsy, atypia at L apex; Jan 2017 - new nodule pending MRI and rpt biopsy Laverle Patter)    Patient Active Problem List   Diagnosis Date Noted  . Systolic murmur 12/10/2016  . AK (actinic keratosis) 11/15/2014  . Prostate cancer (HCC) 08/15/2013  . Healthcare maintenance 08/05/2012  . HTN (hypertension)   . HLD (hyperlipidemia) 03/19/2011  . 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 TONSILLECTOMY  1958  . vascetomy  1985       Home Medications    Prior to Admission medications   Medication Sig Start Date End Date Taking? Authorizing Provider  amLODipine (NORVASC) 5 MG tablet TAKE 1 TABLET BY MOUTH  DAILY Patient taking differently: Take 5 mg by mouth once a day 01/12/17   Eustaquio Boyden, MD  aspirin 81 MG tablet Take 81 mg by mouth daily.      [provider]  cholecalciferol (VITAMIN D) 1000 UNITS tablet Take 1,000 Units by mouth daily.    [provider]    fenofibrate (TRICOR) 145 MG tablet TAKE 1 TABLET BY MOUTH AT  BEDTIME Patient taking differently: Take 145 mg by mouth at bedtime 01/12/17   Eustaquio Boyden, MD  fish oil-omega-3 fatty acids 1000 MG capsule Take 2 g by mouth daily.      [provider]  Glucosamine-Chondroit-Vit C-Mn (GLUCOSAMINE 1500 COMPLEX PO) Take by mouth 2 (two) times daily.    [provider]  loratadine (CLARITIN) 10 MG tablet Take 10 mg by mouth daily.      [provider]  Lysine 500 MG TABS Take 1 tablet by mouth daily.      [provider]  Multiple Vitamins-Minerals (CENTRUM PO) Take 1 tablet by mouth daily.      [provider]  rosuvastatin (CRESTOR) 40 MG tablet TAKE 1 TABLET BY MOUTH AT  BEDTIME Patient taking differently: Take 40 mg by mouth at bedtime 01/12/17   Eustaquio Boyden, MD  sulfaSALAzine (AZULFIDINE) 500 MG tablet Take 500 mg by mouth 2 (two) times daily.      [provider]  vitamin C (ASCORBIC ACID) 500 MG tablet Take 500 mg by mouth daily.      [provider]  vitamin E 400 UNIT capsule Take 400 Units by mouth daily.      [provider]    Family History Family History  Problem Relation Age  of Onset  . Stroke Father   . Diabetes Father   . Coronary artery disease Mother 53       MI, CHF  . Cancer Other        aunts and uncles    Social History Social History  Substance Use Topics  . Smoking status: Former Smoker    Years: 20.00    Types: Cigarettes  . Smokeless tobacco: Former Systems developer    Quit date: 11/10/2000  . Alcohol use No     Allergies   Patient has no known allergies.   Review of Systems Review of Systems  Unable to perform ROS: Patient unresponsive     Physical Exam Updated Vital Signs BP (!) 157/93   Pulse 63   Temp (!) 97.1 F (36.2 C) (Axillary)   Resp (!) 21   SpO2 100%   Physical Exam  Constitutional: He appears well-developed and well-nourished. He appears distressed.  HENT:   Head: Normocephalic.  Mouth/Throat: Oropharynx is clear and moist. No oropharyngeal exudate.  Eyes: Pupils are equal, round, and reactive to light.  Cardiovascular: Normal rate and intact distal pulses.   No murmur heard. Pulmonary/Chest: No stridor. Bradypnea noted. He is in respiratory distress. He exhibits no tenderness.  Abdominal: There is no tenderness.  Musculoskeletal: He exhibits no tenderness.  Neurological: He is unresponsive. He exhibits abnormal muscle tone. GCS eye subscore is 1. GCS verbal subscore is 1. GCS motor subscore is 1.  Skin: Capillary refill takes less than 2 seconds. He is not diaphoretic.  Nursing note and vitals reviewed.    ED Treatments / Results  Labs (all labs ordered are listed, but only abnormal results are displayed) Labs Reviewed  APTT - Abnormal; Notable for the following:       Result Value   aPTT 51 (*)    All other components within normal limits  COMPREHENSIVE METABOLIC PANEL - Abnormal; Notable for the following:    Potassium 3.4 (*)    Glucose, Bld 125 (*)    All other components within normal limits  CBG MONITORING, ED - Abnormal; Notable for the following:    Glucose-Capillary 104 (*)    All other components within normal limits  I-STAT CHEM 8, ED - Abnormal; Notable for the following:    Potassium 5.2 (*)    Glucose, Bld 121 (*)    Calcium, Ion 1.10 (*)    All other components within normal limits  I-STAT ARTERIAL BLOOD GAS, ED - Abnormal; Notable for the following:    pO2, Arterial 305.0 (*)    All other components within normal limits  MRSA PCR SCREENING  ETHANOL  PROTIME-INR  CBC  DIFFERENTIAL  I-STAT TROPONIN, ED    EKG  EKG Interpretation None       Radiology Dg Chest Portable 1 View  Result Date: 08/27/2017 CLINICAL DATA:  Respiratory failure.  History of prostate cancer. EXAM: PORTABLE CHEST 1 VIEW COMPARISON:  None. FINDINGS: Endotracheal tube tip at the carina. Nasogastric tube past GE junction, distal  tip out of field-of-view. Cardiomediastinal silhouette is normal. Calcified aortic knob. Mild bronchitic changes without pleural effusion or focal consolidation. No pneumothorax. Soft tissue planes and included osseous structures are nonsuspicious. IMPRESSION: Mild bronchitic changes. Endotracheal tube tip at the carina, recommend 1-2 cm of retraction. Nasogastric tube tip past GE junction. Aortic Atherosclerosis (ICD10-I70.0). Electronically Signed   By: Elon Alas M.D.   On: 08/22/2017 20:10   Ct Head Code Stroke Wo Contrast  Result Date: 08/25/2017 CLINICAL  DATA:  Code stroke. Worst headache of life after mowing lawn. Unresponsive, arm weakness. History of prostate cancer. EXAM: CT HEAD WITHOUT CONTRAST TECHNIQUE: Contiguous axial images were obtained from the base of the skull through the vertex without intravenous contrast. COMPARISON:  None. FINDINGS: BRAIN: Acute symmetric midbrain/tectal hemorrhage with intraventricular extension. Moderate acute obstructive hydrocephalus with interstitial/transependymal edema. No acute large vascular territory infarct. No midline shift. No abnormal extra-axial fluid collections. Basal cisterns are patent. VASCULAR: Mild calcific atherosclerosis carotid siphon. SKULL/SOFT TISSUES: No skull fracture. No significant soft tissue swelling. ORBITS/SINUSES: The included ocular globes and orbital contents are normal.The mastoid aircells and included paranasal sinuses are well-aerated. OTHER: None. IMPRESSION: 1. Acute midbrain hemorrhage with intraventricular extension. 2. Moderate acute obstructive hydrocephalus. 3. Dr. Cheral Marker paged via [place] hospital paging system on 08/17/2017 at 8:34 pm, awaiting return call. 4. Critical Value/emergent results were called by telephone at the time of interpretation on 08/15/2017 at 8:38 pm to Dr. Pilar Plate MU , who verbally acknowledged these results. Electronically Signed   By: Elon Alas M.D.   On: 09/09/2017 20:38     Procedures Procedure Name: Intubation Date/Time: 08/27/2017 12:08 PM Performed by: Courtney Paris Pre-anesthesia Checklist: Patient identified, Emergency Drugs available, Patient being monitored and Suction available Oxygen Delivery Method: Ambu bag Preoxygenation: Pre-oxygenation with 100% oxygen Induction Type: Rapid sequence Laryngoscope Size: Glidescope Grade View: Grade I Tube size: 7.5 mm Number of attempts: 1 Placement Confirmation: ETT inserted through vocal cords under direct vision,  Positive ETCO2 and Breath sounds checked- equal and bilateral Secured at: 24 cm Tube secured with: ETT holder Comments: Tube partially pulled out after intubation, revisualised with glidescope and repositioned past the cords without difficulty.       (including critical care time)  CRITICAL CARE Performed by: Gwenyth Allegra Tegeler Total critical care time: 45 minutes Critical care time was exclusive of separately billable procedures and treating other patients. Critical care was necessary to treat or prevent imminent or life-threatening deterioration. Critical care was time spent personally by me on the following activities: development of treatment plan with patient and/or surrogate as well as nursing, discussions with consultants, evaluation of patient's response to treatment, examination of patient, obtaining history from patient or surrogate, ordering and performing treatments and interventions, ordering and review of laboratory studies, ordering and review of radiographic studies, pulse oximetry and re-evaluation of patient's condition.    Medications Ordered in ED Medications  propofol (DIPRIVAN) 1000 MG/100ML infusion (not administered)  clevidipine (CLEVIPREX) infusion 0.5 mg/mL (4 mg/hr Intravenous Rate/Dose Change 08/15/2017 2119)  labetalol (NORMODYNE,TRANDATE) injection 10 mg (10 mg Intravenous Given 08/29/2017 2050)     Initial Impression / Assessment and Plan / ED  Course  I have reviewed the triage vital signs and the nursing notes.  Pertinent labs & imaging results that were available during my care of the patient were reviewed by me and considered in my medical decision making (see chart for details).     HAKIEM MALIZIA is a 66 y.o. male with a past medical history significant for HTN, HLD, and prostate CA who presents for AMS, respiratory failure, and L sided weakness as an activated code stroke.   According to EMS, patient was mowing grass and then came in with sudden headache. Wife called 911 and found patient having left arm and left face tingling and weakness. By the time EMS arrived, patient was unresponsive. Patient arrived with nasal and oral airway and getting bagged for respirations. Patient completely unresponsive.  On  arrival, patient was not protecting his airway. GCS was three. Decision made to intimate for airway protection. Patient intubated initially with no medications however, patient moved after tube was passed. Respiratory reported he was having difficulty with the ventilator and on reassessment  with the [place], the cuff was just proximal to the court. Tube was repositioned without difficulty fixing the ventilator problem.  Patient taken to CT scanner and was found to have a brainstem hemorrhage. Neurology spoke with neurosurgery who felt patient had a non-survivable injury and was not a candidate for  surgical intervention.  Patient's blood pressure was control with IV medications. Patient admitted to ICU for further management by neurology team and critical-care team anticipate patient becoming palliative goals however, family had not made that decision yet as not all family was present.  Patient taken to ICU for further management.   Final Clinical Impressions(s) / ED Diagnoses   Final diagnoses:  Nontraumatic intracerebral hemorrhage, unspecified cerebral location, unspecified laterality ([place])  Acute respiratory failure with  hypoxia (HCC)    Clinical Impression: 1. Nontraumatic intracerebral hemorrhage, unspecified cerebral location, unspecified laterality ([place])   2. Acute respiratory failure with hypoxia [facility])     Disposition: Admit to Critical care and Neurology    Tegeler, Gwenyth Allegra, MD 08/27/17 1246

## 2017-08-27 LAB — MRSA PCR SCREENING: MRSA by PCR: NEGATIVE

## 2017-08-27 MED ORDER — GLYCOPYRROLATE 0.2 MG/ML IJ SOLN
0.2000 mg | INTRAMUSCULAR | Status: DC | PRN
  Administered 2017-08-27: 0.2 mg via INTRAVENOUS
  Filled 2017-08-27: qty 1

## 2017-08-27 MED ORDER — GLYCOPYRROLATE 0.2 MG/ML IJ SOLN: 0.2000 mg | INTRAMUSCULAR | Status: DC | PRN

## 2017-08-27 MED ORDER — GLYCOPYRROLATE 1 MG PO TABS
1.0000 mg | ORAL_TABLET | ORAL | Status: DC | PRN
  Filled 2017-08-27: qty 1

## 2017-08-27 NOTE — Progress Notes (Signed)
Responded to [facility] consult to support patient and family.  Several family members at bedside. Family tearful and having difficulty processing grief. Family experiencing anticipatory grief. Provided presence, empathetic listening and emotional support.  Family wanted time alone with love one. Made family aware of our services if needed later. Chaplain will follow as needed.    08/27/17 0955  Clinical Encounter Type  Visited With Patient and family together;Health care provider  Visit Type Initial;Spiritual support;Patient actively dying  Referral From Nurse  Spiritual Encounters  Spiritual Needs Emotional;Grief support  Stress Factors  Family Stress Factors Exhausted;Loss  Cristopher Peru, [facility], Pager [phone]

## 2017-08-27 NOTE — Procedures (Signed)
Extubation Procedure Note  Patient Details:   Name: BARNARD SHARPS DOB: 1951-03-12 MRN: [id number]   Airway Documentation:  Airway 7.5 mm (Active)  Secured at (cm) 25 cm 09/07/2017 11:38 PM  Measured From Lips 09/04/2017 11:38 PM  Secured Location Left 08/12/2017 11:20 PM  Secured By [employer] 08/13/2017 11:38 PM  Tube Holder Repositioned Yes 08/30/2017 11:20 PM  Site Condition Dry 08/17/2017 11:38 PM    Evaluation  O2 sats: stable throughout Complications: No apparent complications Patient did tolerate procedure well. Bilateral Breath Sounds: Clear   No  Pt was extubated per family request for withdraw of life. Pt was extubated per withdraw of life sustaining protocol. Pt was extubated w/o complications noted. RN at bedside throughout extubation procedure. Family is now at the bedside.   Leigh Aurora, BS, RRT, RCP 08/27/2017, 1:35 AM

## 2017-08-27 NOTE — Progress Notes (Signed)
Patient comfort care. Family at bedside.

## 2017-08-27 NOTE — Progress Notes (Signed)
STROKE TEAM PROGRESS NOTE   HISTORY OF PRESENT ILLNESS (per record) Dylan Carter is an 66 y.o. male who presents with acute tetraplegia and LOC preceded by severe headache. Symptom onset was at 6:45 PM while mowing the grass. The patient came into house and stated he had the worst headache he had ever had. He then began to complain of left arm and face tingling. His wife called 6 and the fire department. Fire department was first to arrive, noting left hemiplegia. Soon afterwards EMS arrived and at that time the patient was unable to move any extremity and was unarousable.   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   Patient was not administered IV t-PA secondary to hemorrhage. He was admitted to the neuro ICU for further evaluation and treatment.   SUBJECTIVE (INTERVAL HISTORY) Multiple members of his family are present at the bedside.  He was extubated overnight due to poor medical prognosis and family decision to continue comfort focused treatment. He was unresponsive to examine on morphine infusion. Work of breathing seemed normal although rattling upper airway secretions were prominent.   OBJECTIVE  CBC:   Recent Labs Lab 08/29/2017 1952 09/04/2017 1957  WBC 10.4  --   NEUTROABS 6.0  --   HGB 14.9 15.0  HCT 42.5 44.0  MCV 86.7  --   PLT 272  --     Basic Metabolic Panel:   Recent Labs Lab 08/16/2017 1952 08/10/2017 1957  NA 135 140  K 3.4* 5.2*  CL 102 104  CO2 22  --   GLUCOSE 125* 121*  BUN 8 11  CREATININE 0.84 0.70  CALCIUM 9.0  --    Alcohol Level     Component Value Date/Time   ETH <10 08/10/2017 1951    IMAGING Dg Chest Portable 1 View 09/01/2017 Mild bronchitic changes. Endotracheal tube tip at the carina, recommend 1-2 cm of retraction. Nasogastric tube tip past GE junction. Aortic Atherosclerosis (ICD10-I70.0).  Ct Head Code Stroke Wo  Contrast 08/20/2017 1. Acute midbrain hemorrhage with intraventricular extension. 2. Moderate acute obstructive hydrocephalus.   PHYSICAL EXAM  Temp:  [97 F (36.1 C)-97.1 F (36.2 C)] 97 F (36.1 C) (10/17 2324) Pulse Rate:  [52-120] 113 (10/18 1200) Resp:  [12-26] 20 (10/18 1200) BP: (137-203)/(67-119) 137/67 (10/18 0100) SpO2:  [63 %-100 %] 72 % (10/18 1200) FiO2 (%):  [100 %] 100 % (10/17 2320)  Abbreviated physical examination on comfort care treatment plan  General - Unconscious in no apparent distress, normal work of breathing with noisy airway secretions  Ophthalmologic - Pupils constricted b/l  Motor Strength - No spontaneous limb movement, 0/5 strength to withdraw extremities   ASSESSMENT/PLAN Mr. Dylan Carter is a 66 y.o. 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   Midbrain hemorrhage with intraventricular extension. He is unconscious and without motor function. Family has very reasonably decided to withdraw aggressive supporting measures since his outcome with aggressive care would be death or total debility. Agree with continuing comfort treatments morphine/glycophyrrolate. If he survives today we can transfer out from ICU room.  Hospital day # 1  I have personally examined this patient, reviewed notes, independently viewed imaging studies, participated in medical decision making and plan of care.ROS completed by me personally and pertinent positives fully documented  I have made any additions or clarifications directly to the above note.  He ha spresented with large brainstem hemorrhage with poor neurological exam and prognosis. Chances of survival even with prolonged life support are negligible and family has decided on comfort care and I am in agreement.This patient is critically ill and at significant risk of neurological worsening, death and care requires constant  monitoring of vital signs, hemodynamics,respiratory and cardiac monitoring, extensive review of multiple databases, frequent neurological assessment, discussion with family, other specialists and medical decision making of high complexity.I have made any additions or clarifications directly to the above note.This critical care time does not reflect procedure time, or teaching time or supervisory time of PA/NP/Med Resident etc but could involve care discussion time.  I spent 30 minutes of neurocritical care time  in the care of  this patient.     Antony Contras, MD Medical Director [facility] Pager: [phone] 08/27/2017 11:17 PM   To contact Stroke Continuity provider, please refer to [email or web address] After hours, contact General Neurology

## 2017-08-27 NOTE — Progress Notes (Signed)
Comfort care process explained to family. Propofol discontinued and morphine drip started as ordered by MD and as requested by the family. Family at bedside and to notify staff when ready to withdrawal care on patient.

## 2017-08-31 ENCOUNTER — Telehealth: Payer: Self-pay | Admitting: Family Medicine

## 2017-08-31 ENCOUNTER — Telehealth: Payer: Self-pay

## 2017-08-31 NOTE — Telephone Encounter (Signed)
Death certificate at front desk for pick up.

## 2017-08-31 NOTE — Telephone Encounter (Signed)
Called to express my condolences. No answer. Did not leave message. Will try later.

## 2017-09-03 NOTE — Telephone Encounter (Signed)
Spoke with wife, expressed my condolences. SIL of mary and ralph wilson

## 2017-09-10 NOTE — Progress Notes (Signed)
Time of death called by this nurse and Fortino Sic, RN. Time of death 35 on 2017/09/05. Family at bedside with patient. 200 mL of morphine wasted in sink with Denyse Amass, RN.

## 2017-09-10 NOTE — Discharge Summary (Signed)
Patient ID: JAAZIEL PEATROSS MRN: [id number] DOB/AGE: 05-10-1951 66 y.o.  Admit date: 09-18-17 Death date: September 20, 2017 at 0429  Admission Diagnoses:  Cause of Death:  Respiratory failure secondary to brain herniation from increased intracranial pressure an obstructive hydrocephalus from massive brainstem hemorrhage of likely hypertensive etiology. Patient made DO NOT RESUSCITATE and comfort care by family and ventilatory support withdrawn  Pertinent Medical Diagnosis: Active Problems:   ICH (intracerebral hemorrhage) ([place]) cytotoxic cerebral edema Brain herniation Obstructive hydrocephalus Respiratory failure  Hospital Course:  BRAETON WOLGAMOTT an 65 y.o.malewho presents with acute tetraplegia and LOC preceded by severe headache. Symptom onset was at 6:45 PM while mowing the grass. The patient came into house and stated he had the worstheadache he had ever had. Hethen began to complain ofleft arm and face tingling. Hiswife called 911 and the fire department. Fire department was first to arrive, noting left hemiplegia. Soon afterwards EMS arrived and at that time the patient was unable to move any extremity and was unarousable. 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 Patient was not administered IV t-PA secondary to hemorrhage. He was admitted to the neuro ICU for further evaluation and treatment.patient neurological exam was quite poor with CT scan showing massive brainstem hemorrhage which was not survivable. Family understood the poor prognosis and made the patient DO NOT RESUSCITATE and comfort care. Patient was extubated and kept on morphine infusion for patient comfort. His condition gradually declined and patient passed away.  Signed: Aysia Lowder 08/31/2017, 2:16 PM

## 2017-09-10 DEATH — deceased

## 2017-12-11 ENCOUNTER — Other Ambulatory Visit: Payer: PRIVATE HEALTH INSURANCE

## 2017-12-16 ENCOUNTER — Encounter: Payer: PRIVATE HEALTH INSURANCE | Admitting: Family Medicine

## 2018-01-18 ENCOUNTER — Ambulatory Visit: Payer: Self-pay | Admitting: Rheumatology
